# Patient Record
Sex: Male | Born: 1940
Health system: Southern US, Community
[De-identification: ages and names within clinical notes are randomized; demographics above are authoritative.]

## PROBLEM LIST (undated history)

## (undated) DIAGNOSIS — Z5189 Encounter for other specified aftercare: Secondary | ICD-10-CM

## (undated) DIAGNOSIS — E663 Overweight: Secondary | ICD-10-CM

## (undated) DIAGNOSIS — D509 Iron deficiency anemia, unspecified: Secondary | ICD-10-CM

## (undated) DIAGNOSIS — Z87891 Personal history of nicotine dependence: Secondary | ICD-10-CM

## (undated) DIAGNOSIS — I4821 Permanent atrial fibrillation: Secondary | ICD-10-CM

## (undated) DIAGNOSIS — M11861 Other specified crystal arthropathies, right knee: Secondary | ICD-10-CM

## (undated) DIAGNOSIS — I700 Atherosclerosis of aorta: Secondary | ICD-10-CM

## (undated) DIAGNOSIS — K5790 Diverticulosis of intestine, part unspecified, without perforation or abscess without bleeding: Secondary | ICD-10-CM

## (undated) DIAGNOSIS — I10 Essential (primary) hypertension: Secondary | ICD-10-CM

## (undated) DIAGNOSIS — I723 Aneurysm of iliac artery: Secondary | ICD-10-CM

## (undated) DIAGNOSIS — M1A9XX1 Chronic gout, unspecified, with tophus (tophi): Secondary | ICD-10-CM

## (undated) HISTORY — DX: Other specified crystal arthropathies, right knee: M11.861

## (undated) HISTORY — DX: Diverticulosis of intestine, part unspecified, without perforation or abscess without bleeding: K57.90

## (undated) HISTORY — DX: Overweight: E66.3

## (undated) HISTORY — DX: Permanent atrial fibrillation: I48.21

## (undated) HISTORY — DX: Encounter for other specified aftercare: Z51.89

## (undated) HISTORY — DX: Atherosclerosis of aorta: I70.0

## (undated) HISTORY — DX: Essential (primary) hypertension: I10

## (undated) HISTORY — PX: INGUINAL HERNIA REPAIR: SUR1180

## (undated) HISTORY — DX: Chronic gout, unspecified, with tophus (tophi): M1A.9XX1

## (undated) HISTORY — DX: Aneurysm of iliac artery: I72.3

## (undated) HISTORY — PX: KNEE SURGERY: SHX244

---

## 2016-04-27 DIAGNOSIS — Z01818 Encounter for other preprocedural examination: Secondary | ICD-10-CM | POA: Diagnosis not present

## 2016-04-27 DIAGNOSIS — E876 Hypokalemia: Secondary | ICD-10-CM | POA: Diagnosis not present

## 2016-05-05 DIAGNOSIS — S5002XD Contusion of left elbow, subsequent encounter: Secondary | ICD-10-CM | POA: Diagnosis not present

## 2017-04-10 ENCOUNTER — Other Ambulatory Visit: Payer: Self-pay

## 2017-04-10 ENCOUNTER — Ambulatory Visit (INDEPENDENT_AMBULATORY_CARE_PROVIDER_SITE_OTHER): Payer: Medicare (Managed Care) | Admitting: Internal Medicine

## 2017-04-10 ENCOUNTER — Ambulatory Visit (HOSPITAL_COMMUNITY)
Admission: RE | Admit: 2017-04-10 | Discharge: 2017-04-10 | Disposition: A | Discharge status: Home / Self Care | Payer: Medicare (Managed Care) | Source: Ambulatory Visit | Attending: Internal Medicine | Admitting: Internal Medicine

## 2017-04-10 VITALS — BP 131/56 | HR 44 | Temp 97.3°F | Ht 71.0 in | Wt 215.4 lb

## 2017-04-10 DIAGNOSIS — M1A9XX1 Chronic gout, unspecified, with tophus (tophi): Secondary | ICD-10-CM

## 2017-04-10 DIAGNOSIS — Z950 Presence of cardiac pacemaker: Secondary | ICD-10-CM | POA: Insufficient documentation

## 2017-04-10 DIAGNOSIS — I4891 Unspecified atrial fibrillation: Secondary | ICD-10-CM | POA: Diagnosis not present

## 2017-04-10 DIAGNOSIS — R001 Bradycardia, unspecified: Secondary | ICD-10-CM

## 2017-04-10 DIAGNOSIS — Z0189 Encounter for other specified special examinations: Secondary | ICD-10-CM | POA: Diagnosis not present

## 2017-04-10 DIAGNOSIS — I10 Essential (primary) hypertension: Secondary | ICD-10-CM | POA: Diagnosis not present

## 2017-04-10 DIAGNOSIS — D509 Iron deficiency anemia, unspecified: Secondary | ICD-10-CM | POA: Diagnosis not present

## 2017-04-10 DIAGNOSIS — M109 Gout, unspecified: Secondary | ICD-10-CM

## 2017-04-10 DIAGNOSIS — Z79899 Other long term (current) drug therapy: Secondary | ICD-10-CM | POA: Diagnosis not present

## 2017-04-10 HISTORY — DX: Essential (primary) hypertension: I10

## 2017-04-10 HISTORY — DX: Chronic gout, unspecified, with tophus (tophi): M1A.9XX1

## 2017-04-10 MED ORDER — ALLOPURINOL 100 MG PO TABS: 100.0000 mg | ORAL_TABLET | Freq: Every day | ORAL | 0 refills | Status: DC

## 2017-04-10 MED ORDER — DILTIAZEM HCL ER 240 MG PO CP24: 240.0000 mg | ORAL_CAPSULE | Freq: Every day | ORAL | 0 refills | Status: DC

## 2017-04-10 NOTE — Assessment & Plan Note (Addendum)
Patient is new to the clinic; no prior records in chart.  His current medications include lisinopril 2.5 mg daily, diltiazem extended release 240 mg daily, and metoprolol extended release 25 mg daily. Blood pressure 131/56 and pulse 44.  EKG showing heart rate 54, A. fib with slow ventricular response (junctional rhythm).  Patient denies having any chest pain, episodes of syncope, or shortness of breath.  Reports occasionally feeling lightheaded when getting up all of a sudden; no recent episodes.  Plan -Hold metoprolol in the setting of bradycardia -Continue lisinopril -Continue diltiazem -No anticoagulation noticed in his medication list.  We will try to obtain records from his primary care office in [place].  He will also need an echo if not already done to look for valvular etiology of A. Fib. -Check labs including CBC, CMP, and TSH -Return to the clinic in 3 weeks  Addendum: Creatinine 1.3 and GFR 56.  No prior baseline in chart.  TSH normal.  -We are trying to get records from patient's primary care office in [place].

## 2017-04-10 NOTE — Assessment & Plan Note (Addendum)
Allopurinol as listed in his medications.  Patient denies having any joint pain or noticing any tophi.  Plan -Refill allopurinol 100 mg daily -Check uric acid level -We will try to obtain records from his primary care office in [place].  Addendum: Uric acid level 6.5.  Goal is to keep uric acid level less than 6.   -Please assess compliance at follow-up visit before increasing the dose of allopurinol.

## 2017-04-10 NOTE — Patient Instructions (Addendum)
[name] it was nice meeting you today.  -We will try to obtain records from your previous clinic in [place].  -Continue taking all your medications as before except once change - DO NOT take Metoprolol until your next visit.   -Please return to the clinic in 3 weeks for blood pressure and pulse recheck.  At that time, we will let you know whether it is okay for you to start taking metoprolol again.

## 2017-04-10 NOTE — Progress Notes (Addendum)
   CC: Patient is here to establish care and get medication refills.  HPI:  [name] is a 77 y.o. male with a past medical history of hypertension, gout, A. fib presenting to the clinic to establish care and get medication refills.  He recently moved to [place] from [place]. Please see problem based charting for the status of the patient's current and chronic medical conditions.   No past medical history on file. Review of Systems: Pertinent positives mentioned in HPI. Remainder of all ROS negative.   Physical Exam:  Vitals:   04/10/17 1333  BP: (!) 131/56  Pulse: (!) 44  Temp: (!) 97.3 F (36.3 C)  TempSrc: Oral  SpO2: 100%  Weight: 215 lb 6.4 oz (97.7 kg)  Height: 5\' 11"  (1.803 m)   Physical Exam  Constitutional: He is oriented to person, place, and time. He appears well-developed and well-nourished. No distress.  HENT:  Head: Normocephalic and atraumatic.  Eyes: Right eye exhibits no discharge. Left eye exhibits no discharge.  Cardiovascular: Intact distal pulses.  Bradycardic Irregular rhythm  Pulmonary/Chest: Effort normal and breath sounds normal. No respiratory distress. He has no wheezes. He has no rales.  Abdominal: Soft. Bowel sounds are normal. He exhibits no distension. There is no tenderness.  Musculoskeletal: He exhibits no edema.  Neurological: He is alert and oriented to person, place, and time.  Skin: Skin is warm and dry.    Assessment & Plan:   See Encounters Tab for problem based charting.  Patient seen with Dr. Cleda DaubE. Hoffman

## 2017-04-11 DIAGNOSIS — D509 Iron deficiency anemia, unspecified: Secondary | ICD-10-CM | POA: Insufficient documentation

## 2017-04-11 LAB — CMP14 + ANION GAP
A/G RATIO: 1.3 (ref 1.2–2.2)
ALK PHOS: 79 IU/L (ref 39–117)
ALT: 10 IU/L (ref 0–44)
AST: 17 IU/L (ref 0–40)
Albumin: 4.3 g/dL (ref 3.5–4.8)
Anion Gap: 17 mmol/L (ref 10.0–18.0)
BUN/Creatinine Ratio: 9 — ABNORMAL LOW (ref 10–24)
BUN: 13 mg/dL (ref 8–27)
Bilirubin Total: 0.4 mg/dL (ref 0.0–1.2)
CO2: 22 mmol/L (ref 20–29)
Calcium: 9.2 mg/dL (ref 8.6–10.2)
Chloride: 105 mmol/L (ref 96–106)
Creatinine, Ser: 1.39 mg/dL — ABNORMAL HIGH (ref 0.76–1.27)
GFR calc Af Amer: 56 mL/min/{1.73_m2} — ABNORMAL LOW (ref >59)
GFR, EST NON AFRICAN AMERICAN: 48 mL/min/{1.73_m2} — AB (ref >59)
GLOBULIN, TOTAL: 3.2 g/dL (ref 1.5–4.5)
Glucose: 94 mg/dL (ref 65–99)
POTASSIUM: 4.1 mmol/L (ref 3.5–5.2)
SODIUM: 144 mmol/L (ref 134–144)
Total Protein: 7.5 g/dL (ref 6.0–8.5)

## 2017-04-11 LAB — CBC
Hematocrit: 34.9 % — ABNORMAL LOW (ref 37.5–51.0)
Hemoglobin: 10.1 g/dL — ABNORMAL LOW (ref 13.0–17.7)
MCH: 17.7 pg — AB (ref 26.6–33.0)
MCHC: 28.9 g/dL — ABNORMAL LOW (ref 31.5–35.7)
MCV: 61 fL — ABNORMAL LOW (ref 79–97)
PLATELETS: 386 10*3/uL — AB (ref 150–379)
RBC: 5.71 x10E6/uL (ref 4.14–5.80)
RDW: 20 % — ABNORMAL HIGH (ref 12.3–15.4)
WBC: 5.9 10*3/uL (ref 3.4–10.8)

## 2017-04-11 LAB — URIC ACID: Uric Acid: 6.5 mg/dL (ref 3.7–8.6)

## 2017-04-11 LAB — TSH: TSH: 2.63 u[IU]/mL (ref 0.450–4.500)

## 2017-04-11 NOTE — Assessment & Plan Note (Addendum)
Labs showing hemoglobin 10.1 and MCV 61.  Unclear if the anemia is new.  Patient is new to the clinic and we do not have any prior records. -We will try to obtain records from his primary care office in [place] to determine whether he has already undergone colon cancer screening.  -Consider checking FOBT, iron, ferritin, and TIBC at follow-up visit.  He will need an iron supplement if labs indicate iron deficiency anemia.

## 2017-04-11 NOTE — Progress Notes (Signed)
Internal Medicine Clinic Attending  Case discussed with Dr. Loney Lohathore at the time of the visit.  We reviewed the resident's history and exam and pertinent patient test results.  I agree with the assessment, diagnosis, and plan of care documented in the resident's note. To clarify Dr Gardiner Rhymeathore's note, he will need workup for his microcytic anemia including iron studies, if he is anemia he will need a diagnostic workup for IDA.  Working on getting records today for this complex patient, close follow up needed.

## 2017-04-27 ENCOUNTER — Other Ambulatory Visit: Payer: Self-pay | Admitting: *Deleted

## 2017-04-28 MED ORDER — LISINOPRIL 2.5 MG PO TABS: 2.5000 mg | ORAL_TABLET | Freq: Every day | ORAL | 2 refills | Status: DC

## 2017-05-01 ENCOUNTER — Other Ambulatory Visit: Payer: Self-pay

## 2017-05-01 ENCOUNTER — Encounter: Payer: Self-pay | Admitting: Internal Medicine

## 2017-05-01 ENCOUNTER — Ambulatory Visit (INDEPENDENT_AMBULATORY_CARE_PROVIDER_SITE_OTHER): Payer: Medicare (Managed Care) | Admitting: Internal Medicine

## 2017-05-01 VITALS — BP 134/68 | HR 77 | Temp 97.6°F | Ht 71.0 in | Wt 214.3 lb

## 2017-05-01 DIAGNOSIS — M109 Gout, unspecified: Secondary | ICD-10-CM | POA: Diagnosis not present

## 2017-05-01 DIAGNOSIS — D509 Iron deficiency anemia, unspecified: Secondary | ICD-10-CM

## 2017-05-01 DIAGNOSIS — Z79899 Other long term (current) drug therapy: Secondary | ICD-10-CM

## 2017-05-01 DIAGNOSIS — I10 Essential (primary) hypertension: Secondary | ICD-10-CM | POA: Diagnosis not present

## 2017-05-01 NOTE — Patient Instructions (Signed)
It was a pleasure to see you today [name]. Please make the following changes:  -Please continue taking all your medications -Return to clinic in 3 months  If you have any questions or concerns, please call our clinic at [phone] between 9am-5pm and after hours call [phone] and ask for the internal medicine resident on call. If you feel you are having a medical emergency please call 911.   Thank you, we look forward to help you remain healthy!  Lorenso CourierVahini Kristine Chahal, MD Internal Medicine PGY1

## 2017-05-01 NOTE — Progress Notes (Signed)
   CC: Microcytic Anemia  HPI:  Hayden Harmon is a 77 y.o. with hypertension, gout, microcytic anemia who presented for follow up on microcytic anemia workup. Please see problem based charting for evaluation, assessment, and plan.  No past medical history on file. Review of Systems:  Denies cough, palpitations, sob  Physical Exam:  Vitals:   05/01/17 1326  BP: 134/68  Pulse: 77  Temp: 97.6 F (36.4 C)  TempSrc: Oral  SpO2: 100%  Weight: 214 lb 4.8 oz (97.2 kg)  Height: 5\' 11"  (1.803 m)   Physical Exam  Constitutional: He appears well-developed and well-nourished. No distress.  HENT:  Head: Normocephalic and atraumatic.  Eyes: Conjunctivae are normal.  No pallor noted   Cardiovascular: Normal rate, regular rhythm and normal heart sounds.  Respiratory: Effort normal and breath sounds normal. No respiratory distress. He has no wheezes.  GI: Soft. Bowel sounds are normal. He exhibits no distension. There is no tenderness.  Musculoskeletal: He exhibits no edema.  Neurological: He is alert.  Skin: Skin is warm and dry. No rash noted. He is not diaphoretic. No erythema.  Psychiatric: He has a normal mood and affect. His behavior is normal. Judgment and thought content normal.     Assessment & Plan:   See Encounters Tab for problem based charting.  Patient discussed with Dr. Criselda PeachesMullen

## 2017-05-02 LAB — IRON AND TIBC
Iron Saturation: 5 % — CL (ref 15–55)
Iron: 21 ug/dL — ABNORMAL LOW (ref 38–169)
TIBC: 456 ug/dL — AB (ref 250–450)
UIBC: 435 ug/dL — AB (ref 111–343)

## 2017-05-02 LAB — MAGNESIUM: MAGNESIUM: 2 mg/dL (ref 1.6–2.3)

## 2017-05-02 LAB — FERRITIN: Ferritin: 16 ng/mL — ABNORMAL LOW (ref 30–400)

## 2017-05-02 NOTE — Assessment & Plan Note (Signed)
The patient is new to [facility] clinic as of 04/10/17. On CBC that was ordered the patient was noted to have hb=10.1, mcv=61. The patient's records are still being obtained from his previous pcp's office in [place].  Assessment The patient has microcytic anemia from unknown cause.   Plan  -Ordered iron, ferritin, and TIBC to determine cause of anemia.  -The patient states that he does not recall when he last got a colonoscopy done so will order FIC testing while getting records

## 2017-05-02 NOTE — Assessment & Plan Note (Signed)
  The patient has well controlled blood pressure of 134/68 on lisionpril 2.5mg  qd, metoprolol 25mg  qd, and diltiazem 240mg  qd.   The patient is currently being prescribed magnesium for unknown reason. Ordered magnesium level to determine if patient truly needs.

## 2017-05-02 NOTE — Assessment & Plan Note (Signed)
The patient has a history of gout for which he is currently taking allopurinol 100mg  qd. The patient states that his gout flares usually occur in his dip joints. The patient states that he currently does not have any pain in his joints.

## 2017-05-03 NOTE — Progress Notes (Signed)
Internal Medicine Clinic Attending  Case discussed with Dr. Chundi at the time of the visit.  We reviewed the resident's history and exam and pertinent patient test results.  I agree with the assessment, diagnosis, and plan of care documented in the resident's note. 

## 2017-05-07 ENCOUNTER — Other Ambulatory Visit: Payer: Self-pay | Admitting: Internal Medicine

## 2017-05-07 DIAGNOSIS — M1000 Idiopathic gout, unspecified site: Secondary | ICD-10-CM

## 2017-05-07 DIAGNOSIS — I10 Essential (primary) hypertension: Secondary | ICD-10-CM

## 2017-05-08 ENCOUNTER — Encounter: Payer: Self-pay | Admitting: Internal Medicine

## 2017-05-08 MED ORDER — ALLOPURINOL 100 MG PO TABS: 200.0000 mg | ORAL_TABLET | Freq: Every day | ORAL | 3 refills | Status: DC

## 2017-05-08 MED ORDER — FERROUS SULFATE 325 (65 FE) MG PO TABS: 325.0000 mg | ORAL_TABLET | Freq: Every day | ORAL | 1 refills | Status: DC

## 2017-05-08 NOTE — Telephone Encounter (Signed)
I have yet to meet [name].  Thus I reviewed the available record and have some concerns about reprecribing the diltiazem in the setting of concomitant metoprolol.  His initial pulse with us was 44 on this combination, but it appears to have been 77 on a more recent visit.  He states he was told to stop the metoprolol XL at the initial visit and had only been taking the diltiazem since, including during follow-up with a pulse of 77.  There was an ECG that demonstrated atrial fibrillation with a junctional escape rhythm when he was on 2 nodal agents.  I therefore renewed the diltiazem and removed the metoprolol from his medication list.  I will repeat the ECG while on a single nodal agent at the follow-up visit.  With regards to the allopurinol, it appears that the last uric acid was above target while on 100 mg daily.  Thus, rather than refilling this dose it should be increased to 200 mg daily.  I discussed this with him and he was in favor of the change.  It was therefore increased to 200 mg daily.  We will recheck a uric acid level at the follow-up visit to assure he is < 6.0.

## 2017-05-08 NOTE — Progress Notes (Signed)
The patient was diagnosed with iron deficiency anemia per low ferritin, iron, iron saturation, and high tibc.   -Prescribed ferrous sulfate 325mg  daily -Have not been able to get in touch with patient to inform him to pick up prescription. Will continue trying.

## 2017-05-21 ENCOUNTER — Encounter (HOSPITAL_COMMUNITY): Payer: Self-pay | Admitting: Internal Medicine

## 2017-05-21 ENCOUNTER — Emergency Department (HOSPITAL_COMMUNITY)
Admission: EM | Admit: 2017-05-21 | Discharge: 2017-05-21 | Disposition: A | Discharge status: Home / Self Care | Payer: Medicare Other | Attending: Emergency Medicine | Admitting: Emergency Medicine

## 2017-05-21 DIAGNOSIS — R112 Nausea with vomiting, unspecified: Secondary | ICD-10-CM | POA: Insufficient documentation

## 2017-05-21 DIAGNOSIS — I480 Paroxysmal atrial fibrillation: Secondary | ICD-10-CM | POA: Insufficient documentation

## 2017-05-21 DIAGNOSIS — I10 Essential (primary) hypertension: Secondary | ICD-10-CM | POA: Diagnosis not present

## 2017-05-21 DIAGNOSIS — R197 Diarrhea, unspecified: Secondary | ICD-10-CM | POA: Diagnosis not present

## 2017-05-21 DIAGNOSIS — Z7982 Long term (current) use of aspirin: Secondary | ICD-10-CM | POA: Insufficient documentation

## 2017-05-21 DIAGNOSIS — Z7902 Long term (current) use of antithrombotics/antiplatelets: Secondary | ICD-10-CM | POA: Diagnosis not present

## 2017-05-21 DIAGNOSIS — I4891 Unspecified atrial fibrillation: Secondary | ICD-10-CM

## 2017-05-21 DIAGNOSIS — R000 Tachycardia, unspecified: Secondary | ICD-10-CM | POA: Diagnosis not present

## 2017-05-21 LAB — URINALYSIS, ROUTINE W REFLEX MICROSCOPIC
Bilirubin Urine: NEGATIVE
GLUCOSE, UA: NEGATIVE mg/dL
HGB URINE DIPSTICK: NEGATIVE
Ketones, ur: NEGATIVE mg/dL
Leukocytes, UA: NEGATIVE
Nitrite: NEGATIVE
PH: 5 (ref 5.0–8.0)
PROTEIN: NEGATIVE mg/dL
SPECIFIC GRAVITY, URINE: 1.017 (ref 1.005–1.030)

## 2017-05-21 LAB — COMPREHENSIVE METABOLIC PANEL
ALK PHOS: 63 U/L (ref 38–126)
ALT: 25 U/L (ref 17–63)
AST: 15 U/L (ref 15–41)
Albumin: 3.8 g/dL (ref 3.5–5.0)
Anion gap: 12 (ref 5–15)
BUN: 40 mg/dL — AB (ref 6–20)
CALCIUM: 8 mg/dL — AB (ref 8.9–10.3)
CHLORIDE: 103 mmol/L (ref 101–111)
CO2: 25 mmol/L (ref 22–32)
CREATININE: 1.55 mg/dL — AB (ref 0.61–1.24)
GFR, EST AFRICAN AMERICAN: 48 mL/min — AB (ref >60)
GFR, EST NON AFRICAN AMERICAN: 41 mL/min — AB (ref >60)
Glucose, Bld: 148 mg/dL — ABNORMAL HIGH (ref 65–99)
Potassium: 3 mmol/L — ABNORMAL LOW (ref 3.5–5.1)
SODIUM: 140 mmol/L (ref 135–145)
Total Bilirubin: 1.1 mg/dL (ref 0.3–1.2)
Total Protein: 6.9 g/dL (ref 6.5–8.1)

## 2017-05-21 LAB — CBC
HCT: 40.6 % (ref 39.0–52.0)
Hemoglobin: 11.6 g/dL — ABNORMAL LOW (ref 13.0–17.0)
MCH: 17.9 pg — AB (ref 26.0–34.0)
MCHC: 28.6 g/dL — ABNORMAL LOW (ref 30.0–36.0)
MCV: 62.6 fL — AB (ref 78.0–100.0)
Platelets: 351 10*3/uL (ref 150–400)
RBC: 6.49 MIL/uL — AB (ref 4.22–5.81)
RDW: 21 % — AB (ref 11.5–15.5)
WBC: 13.1 10*3/uL — ABNORMAL HIGH (ref 4.0–10.5)

## 2017-05-21 LAB — LIPASE, BLOOD: Lipase: 28 U/L (ref 11–51)

## 2017-05-21 MED ORDER — ONDANSETRON HCL 4 MG/2ML IJ SOLN
4.0000 mg | Freq: Once | INTRAMUSCULAR | Status: AC
  Administered 2017-05-21: 4 mg via INTRAVENOUS
  Filled 2017-05-21: qty 2

## 2017-05-21 MED ORDER — SODIUM CHLORIDE 0.9 % IV BOLUS (SEPSIS)
1000.0000 mL | Freq: Once | INTRAVENOUS | Status: AC
  Administered 2017-05-21: 1000 mL via INTRAVENOUS

## 2017-05-21 MED ORDER — DILTIAZEM HCL 25 MG/5ML IV SOLN
10.0000 mg | Freq: Once | INTRAVENOUS | Status: AC
  Administered 2017-05-21: 10 mg via INTRAVENOUS
  Filled 2017-05-21 (×2): qty 5

## 2017-05-21 MED ORDER — ONDANSETRON 4 MG PO TBDP: 4.0000 mg | ORAL_TABLET | Freq: Three times a day (TID) | ORAL | 0 refills | Status: DC | PRN

## 2017-05-21 MED ORDER — POTASSIUM CHLORIDE CRYS ER 20 MEQ PO TBCR
40.0000 meq | EXTENDED_RELEASE_TABLET | Freq: Once | ORAL | Status: AC
  Administered 2017-05-21: 40 meq via ORAL
  Filled 2017-05-21: qty 2

## 2017-05-21 MED ORDER — SODIUM CHLORIDE 0.9 % IV BOLUS (SEPSIS)
500.0000 mL | Freq: Once | INTRAVENOUS | Status: AC
  Administered 2017-05-21: 500 mL via INTRAVENOUS

## 2017-05-21 NOTE — ED Notes (Signed)
Bed: [id number] Expected date:  Expected time:  Means of arrival:  Comments: 77 yo N/V/D

## 2017-05-21 NOTE — ED Triage Notes (Signed)
[facility] from home c/o N/V/D since Wednesday that has become worse this morning. Denies SOB/Chest pain.Pt given 700mLs NS and 4mg  zofran by [facility]. Pt has hx of a-fib and HR is currently 162.

## 2017-05-21 NOTE — ED Notes (Signed)
ED Provider at bedside. 

## 2017-05-21 NOTE — ED Provider Notes (Addendum)
[facility]-EMERGENCY DEPT Provider Note   CSN: [id number] Arrival date & time: 05/21/17  1126     History   Chief Complaint Chief Complaint  Patient presents with  . Nausea  . Emesis  . Diarrhea    HPI Hayden Harmon is a 77 y.o. male.  77 year old male with past medical history including hypertension, gout, iron deficiency anemia who presents with nausea, vomiting, and diarrhea.  Patient began getting sick 4 days ago with vomiting and nonbloody diarrhea.  His last episode of diarrhea was 2 nights ago but his vomiting is continued, last episode was approximately 1 hour ago.  He denies any associated abdominal pain.  No sick contacts.  No fevers or URI symptoms.  No urinary symptoms.  He has not had his morning medications yet today.   The history is provided by the patient.  Emesis   Associated symptoms include diarrhea.  Diarrhea   Associated symptoms include vomiting.    Past Medical History:  Diagnosis Date  . Essential hypertension 04/10/2017    Patient Active Problem List   Diagnosis Date Noted  . Iron deficiency anemia 04/11/2017  . Essential hypertension 04/10/2017  . Gout 04/10/2017    History reviewed. No pertinent surgical history.     Home Medications    Prior to Admission medications   Medication Sig Start Date End Date Taking? Authorizing Provider  allopurinol (ZYLOPRIM) 100 MG tablet Take 2 tablets (200 mg total) by mouth daily. Patient taking differently: Take 100 mg by mouth daily.  05/08/17  Yes Doneen Poisson, MD  aspirin EC 81 MG tablet Take 81 mg by mouth daily.   Yes [provider]  clopidogrel (PLAVIX) 75 MG tablet Take 75 mg by mouth daily.   Yes [provider]  diltiazem (DILT-XR) 240 MG 24 hr capsule Take 1 capsule (240 mg total) by mouth daily. 05/08/17  Yes Doneen Poisson, MD  lisinopril (PRINIVIL,ZESTRIL) 2.5 MG tablet Take 1 tablet (2.5 mg total) by mouth daily. 04/28/17  Yes Burns Spain, MD  magnesium oxide (MAG-OX) 400 MG tablet Take 400 mg by mouth 2 (two) times daily.   Yes [provider]  metoprolol succinate (TOPROL-XL) 25 MG 24 hr tablet Take 25 mg by mouth daily.   Yes [provider]  ferrous sulfate 325 (65 FE) MG tablet Take 1 tablet (325 mg total) by mouth daily. Patient not taking: Reported on 05/21/2017 05/08/17 06/07/17  Lorenso Courier, MD  ondansetron (ZOFRAN ODT) 4 MG disintegrating tablet Take 1 tablet (4 mg total) by mouth every 8 (eight) hours as needed for nausea or vomiting. 05/21/17   Lucy Woolever, Ambrose Finland, MD    Family History No family history on file.  Social History Social History   Tobacco Use  . Smoking status: Never Smoker  . Smokeless tobacco: Never Used  Substance Use Topics  . Alcohol use: Not on file  . Drug use: Not on file     Allergies   Patient has no known allergies.   Review of Systems Review of Systems  Gastrointestinal: Positive for diarrhea and vomiting.   All other systems reviewed and are negative except that which was mentioned in HPI   Physical Exam Updated Vital Signs BP 129/86   Pulse (!) 110   Temp (!) 97.5 F (36.4 C) (Oral)   Resp 17   SpO2 96%   Physical Exam  Constitutional: He is oriented to person, place, and time. He appears well-developed and well-nourished. No  distress.  HENT:  Head: Normocephalic and atraumatic.  Mouth/Throat: Oropharynx is clear and moist.  Moist mucous membranes  Eyes: Conjunctivae are normal. Pupils are equal, round, and reactive to light.  Neck: Neck supple.  Cardiovascular: Normal heart sounds. An irregularly irregular rhythm present. Tachycardia present.  No murmur heard. Pulmonary/Chest: Effort normal and breath sounds normal.  Abdominal: Soft. Bowel sounds are normal. He exhibits no distension. There is no tenderness.  Musculoskeletal: He exhibits no edema.  Neurological: He is alert and oriented to person, place, and time.  Fluent speech    Skin: Skin is warm and dry.  Psychiatric: He has a normal mood and affect. Judgment normal.  Nursing note and vitals reviewed.    ED Treatments / Results  Labs (all labs ordered are listed, but only abnormal results are displayed) Labs Reviewed  COMPREHENSIVE METABOLIC PANEL - Abnormal; Notable for the following components:      Result Value   Potassium 3.0 (*)    Glucose, Bld 148 (*)    BUN 40 (*)    Creatinine, Ser 1.55 (*)    Calcium 8.0 (*)    GFR calc non Af Amer 41 (*)    GFR calc Af Amer 48 (*)    All other components within normal limits  CBC - Abnormal; Notable for the following components:   WBC 13.1 (*)    RBC 6.49 (*)    Hemoglobin 11.6 (*)    MCV 62.6 (*)    MCH 17.9 (*)    MCHC 28.6 (*)    RDW 21.0 (*)    All other components within normal limits  URINALYSIS, ROUTINE W REFLEX MICROSCOPIC - Abnormal; Notable for the following components:   Color, Urine AMBER (*)    Bacteria, UA RARE (*)    Squamous Epithelial / LPF 0-5 (*)    All other components within normal limits  LIPASE, BLOOD    EKG  EKG Interpretation  Date/Time:  Sunday May 21 2017 11:42:18 EDT Ventricular Rate:  119 PR Interval:    QRS Duration: 91 QT Interval:  285 QTC Calculation: 401 R Axis:   24 Text Interpretation:  Atrial fibrillation Probable posterior infarct, recent Lateral leads are also involved A fib with RVR new from previous which was slow A fib Confirmed by Frederick Peers [phone]) on 05/21/2017 12:42:37 PM       Radiology No results found.  Procedures .Critical Care Performed by: Laurence Spates, MD Authorized by: Laurence Spates, MD   Critical care provider statement:    Critical care time (minutes):  30   Critical care was necessary to treat or prevent imminent or life-threatening deterioration of the following conditions:  Cardiac failure   Critical care was time spent personally by me on the following activities:  Development of treatment plan with  patient or surrogate, evaluation of patient's response to treatment, examination of patient, obtaining history from patient or surrogate, ordering and performing treatments and interventions, ordering and review of laboratory studies, ordering and review of radiographic studies and re-evaluation of patient's condition   (including critical care time)  Medications Ordered in ED Medications  sodium chloride 0.9 % bolus 1,000 mL (0 mLs Intravenous Stopped 05/21/17 1410)  ondansetron (ZOFRAN) injection 4 mg (4 mg Intravenous Given 05/21/17 1259)  potassium chloride SA (K-DUR,KLOR-CON) CR tablet 40 mEq (40 mEq Oral Given 05/21/17 1323)  diltiazem (CARDIZEM) injection 10 mg (10 mg Intravenous Given 05/21/17 1548)  sodium chloride 0.9 % bolus 500 mL (0 mLs  Intravenous Stopped 05/21/17 1543)     Initial Impression / Assessment and Plan / ED Course  I have reviewed the triage vital signs and the nursing notes.  Pertinent labs & imaging results that were available during my care of the patient were reviewed by me and considered in my medical decision making (see chart for details).     Pt well appearing on exam, tachycardic with atrial fibrillation with RVR but this is likely due to the fact that he has not had his diltiazem this morning.  His abdomen was soft and nontender and he denied any abdominal pain.  Gave IV fluids and Zofran and obtain labs.  Labs show potassium 3.0 likely from his GI losses.  Gave oral repletion.  Creatinine 1.55, WBC 13.1, hemoglobin 11.6, normal LFTs and lipase.  Patient tolerating liquids here with resolution of vomiting.  He has continued to deny any abdominal pain therefore I do not feel he needs any imaging. Gave home dilt and 1 dose IV dilt for rate control.  Is completely asymptomatic and sleeping on reassessment.  His heart rate has improved.  I suspect his RVR was related to no medication today as well as some dehydration.  I have encouraged to continue home medications.   Extensively reviewed return precautions.  He voiced understanding. Final Clinical Impressions(s) / ED Diagnoses   Final diagnoses:  Nausea vomiting and diarrhea  Atrial fibrillation with RVR [facility](HCC)    ED Discharge Orders        Ordered    ondansetron (ZOFRAN ODT) 4 MG disintegrating tablet  Every 8 hours PRN     05/21/17 1631       Emmary Culbreath, Ambrose Finlandachel Morgan, MD 05/21/17 1633    Arriana Lohmann, Ambrose Finlandachel Morgan, MD 05/30/17 1051

## 2017-05-21 NOTE — ED Notes (Signed)
Patient tolerating liquids and medications well. No emesis episodes noted by this RN.

## 2017-05-22 ENCOUNTER — Telehealth (HOSPITAL_COMMUNITY): Payer: Self-pay | Admitting: *Deleted

## 2017-05-22 NOTE — Telephone Encounter (Signed)
LM on vcml of pref # for pt to [facility] and sched appt with afib clinic.  Pt left ED in afib.

## 2017-05-25 ENCOUNTER — Encounter (HOSPITAL_COMMUNITY): Payer: Self-pay | Admitting: Nurse Practitioner

## 2017-05-25 ENCOUNTER — Other Ambulatory Visit: Payer: Self-pay

## 2017-05-25 ENCOUNTER — Encounter (HOSPITAL_COMMUNITY): Payer: Self-pay | Admitting: *Deleted

## 2017-05-25 ENCOUNTER — Emergency Department (HOSPITAL_COMMUNITY): Payer: Medicare Other

## 2017-05-25 ENCOUNTER — Ambulatory Visit (HOSPITAL_BASED_OUTPATIENT_CLINIC_OR_DEPARTMENT_OTHER)
Admission: RE | Admit: 2017-05-25 | Discharge: 2017-05-25 | Disposition: A | Discharge status: Home / Self Care | Payer: Medicare Other | Source: Ambulatory Visit | Attending: Nurse Practitioner | Admitting: Nurse Practitioner

## 2017-05-25 ENCOUNTER — Ambulatory Visit: Payer: Medicare Other

## 2017-05-25 ENCOUNTER — Inpatient Hospital Stay (HOSPITAL_COMMUNITY)
Admission: EM | Admit: 2017-05-25 | Discharge: 2017-06-05 | DRG: 336 | Disposition: A | Discharge status: Home Health | Payer: Medicare Other | Attending: Student in an Organized Health Care Education/Training Program | Admitting: Student in an Organized Health Care Education/Training Program

## 2017-05-25 VITALS — BP 132/56 | HR 173 | Ht 71.0 in | Wt 191.0 lb

## 2017-05-25 DIAGNOSIS — A09 Infectious gastroenteritis and colitis, unspecified: Secondary | ICD-10-CM | POA: Diagnosis not present

## 2017-05-25 DIAGNOSIS — Z7902 Long term (current) use of antithrombotics/antiplatelets: Secondary | ICD-10-CM | POA: Diagnosis not present

## 2017-05-25 DIAGNOSIS — R197 Diarrhea, unspecified: Secondary | ICD-10-CM

## 2017-05-25 DIAGNOSIS — N390 Urinary tract infection, site not specified: Secondary | ICD-10-CM | POA: Diagnosis not present

## 2017-05-25 DIAGNOSIS — N179 Acute kidney failure, unspecified: Secondary | ICD-10-CM | POA: Diagnosis present

## 2017-05-25 DIAGNOSIS — N183 Chronic kidney disease, stage 3 (moderate): Secondary | ICD-10-CM | POA: Diagnosis present

## 2017-05-25 DIAGNOSIS — E871 Hypo-osmolality and hyponatremia: Secondary | ICD-10-CM | POA: Diagnosis not present

## 2017-05-25 DIAGNOSIS — I4821 Permanent atrial fibrillation: Secondary | ICD-10-CM | POA: Diagnosis present

## 2017-05-25 DIAGNOSIS — R1110 Vomiting, unspecified: Secondary | ICD-10-CM

## 2017-05-25 DIAGNOSIS — B965 Pseudomonas (aeruginosa) (mallei) (pseudomallei) as the cause of diseases classified elsewhere: Secondary | ICD-10-CM | POA: Diagnosis not present

## 2017-05-25 DIAGNOSIS — R9431 Abnormal electrocardiogram [ECG] [EKG]: Secondary | ICD-10-CM | POA: Insufficient documentation

## 2017-05-25 DIAGNOSIS — I700 Atherosclerosis of aorta: Secondary | ICD-10-CM

## 2017-05-25 DIAGNOSIS — Z7982 Long term (current) use of aspirin: Secondary | ICD-10-CM | POA: Diagnosis not present

## 2017-05-25 DIAGNOSIS — K5651 Intestinal adhesions [bands], with partial obstruction: Secondary | ICD-10-CM | POA: Diagnosis not present

## 2017-05-25 DIAGNOSIS — I4891 Unspecified atrial fibrillation: Secondary | ICD-10-CM

## 2017-05-25 DIAGNOSIS — K529 Noninfective gastroenteritis and colitis, unspecified: Secondary | ICD-10-CM | POA: Diagnosis not present

## 2017-05-25 DIAGNOSIS — I480 Paroxysmal atrial fibrillation: Secondary | ICD-10-CM | POA: Diagnosis present

## 2017-05-25 DIAGNOSIS — I252 Old myocardial infarction: Secondary | ICD-10-CM

## 2017-05-25 DIAGNOSIS — R112 Nausea with vomiting, unspecified: Secondary | ICD-10-CM | POA: Diagnosis not present

## 2017-05-25 DIAGNOSIS — E876 Hypokalemia: Secondary | ICD-10-CM

## 2017-05-25 DIAGNOSIS — K56609 Unspecified intestinal obstruction, unspecified as to partial versus complete obstruction: Secondary | ICD-10-CM | POA: Diagnosis not present

## 2017-05-25 DIAGNOSIS — I129 Hypertensive chronic kidney disease with stage 1 through stage 4 chronic kidney disease, or unspecified chronic kidney disease: Secondary | ICD-10-CM | POA: Diagnosis present

## 2017-05-25 DIAGNOSIS — Z4659 Encounter for fitting and adjustment of other gastrointestinal appliance and device: Secondary | ICD-10-CM

## 2017-05-25 DIAGNOSIS — Z978 Presence of other specified devices: Secondary | ICD-10-CM | POA: Diagnosis not present

## 2017-05-25 DIAGNOSIS — Z4682 Encounter for fitting and adjustment of non-vascular catheter: Secondary | ICD-10-CM | POA: Diagnosis not present

## 2017-05-25 DIAGNOSIS — Z0189 Encounter for other specified special examinations: Secondary | ICD-10-CM

## 2017-05-25 DIAGNOSIS — R634 Abnormal weight loss: Secondary | ICD-10-CM | POA: Diagnosis not present

## 2017-05-25 DIAGNOSIS — I2510 Atherosclerotic heart disease of native coronary artery without angina pectoris: Secondary | ICD-10-CM | POA: Diagnosis not present

## 2017-05-25 DIAGNOSIS — D509 Iron deficiency anemia, unspecified: Secondary | ICD-10-CM | POA: Diagnosis not present

## 2017-05-25 DIAGNOSIS — E861 Hypovolemia: Secondary | ICD-10-CM | POA: Diagnosis not present

## 2017-05-25 DIAGNOSIS — R4182 Altered mental status, unspecified: Secondary | ICD-10-CM | POA: Diagnosis not present

## 2017-05-25 DIAGNOSIS — N323 Diverticulum of bladder: Secondary | ICD-10-CM | POA: Diagnosis not present

## 2017-05-25 DIAGNOSIS — E860 Dehydration: Secondary | ICD-10-CM | POA: Diagnosis present

## 2017-05-25 DIAGNOSIS — I482 Chronic atrial fibrillation: Secondary | ICD-10-CM | POA: Diagnosis not present

## 2017-05-25 DIAGNOSIS — N400 Enlarged prostate without lower urinary tract symptoms: Secondary | ICD-10-CM | POA: Diagnosis present

## 2017-05-25 DIAGNOSIS — K565 Intestinal adhesions [bands], unspecified as to partial versus complete obstruction: Secondary | ICD-10-CM | POA: Diagnosis not present

## 2017-05-25 DIAGNOSIS — I10 Essential (primary) hypertension: Secondary | ICD-10-CM | POA: Diagnosis not present

## 2017-05-25 DIAGNOSIS — Z79899 Other long term (current) drug therapy: Secondary | ICD-10-CM | POA: Diagnosis not present

## 2017-05-25 DIAGNOSIS — R1114 Bilious vomiting: Secondary | ICD-10-CM | POA: Diagnosis not present

## 2017-05-25 DIAGNOSIS — R143 Flatulence: Secondary | ICD-10-CM | POA: Diagnosis not present

## 2017-05-25 DIAGNOSIS — B9689 Other specified bacterial agents as the cause of diseases classified elsewhere: Secondary | ICD-10-CM | POA: Diagnosis not present

## 2017-05-25 DIAGNOSIS — R109 Unspecified abdominal pain: Secondary | ICD-10-CM | POA: Diagnosis not present

## 2017-05-25 HISTORY — DX: Permanent atrial fibrillation: I48.21

## 2017-05-25 HISTORY — DX: Personal history of nicotine dependence: Z87.891

## 2017-05-25 HISTORY — DX: Iron deficiency anemia, unspecified: D50.9

## 2017-05-25 HISTORY — DX: Atherosclerosis of aorta: I70.0

## 2017-05-25 LAB — CBC WITH DIFFERENTIAL/PLATELET
BASOS PCT: 0 %
Basophils Absolute: 0 10*3/uL (ref 0.0–0.1)
EOS PCT: 1 %
Eosinophils Absolute: 0.2 10*3/uL (ref 0.0–0.7)
HCT: 50.3 % (ref 39.0–52.0)
HEMOGLOBIN: 15.4 g/dL (ref 13.0–17.0)
Lymphocytes Relative: 10 %
Lymphs Abs: 1.6 10*3/uL (ref 0.7–4.0)
MCH: 18.6 pg — AB (ref 26.0–34.0)
MCHC: 30.6 g/dL (ref 30.0–36.0)
MCV: 60.8 fL — AB (ref 78.0–100.0)
MONO ABS: 1.9 10*3/uL — AB (ref 0.1–1.0)
Monocytes Relative: 12 %
NEUTROS PCT: 77 %
Neutro Abs: 12.3 10*3/uL — ABNORMAL HIGH (ref 1.7–7.7)
PLATELETS: 419 10*3/uL — AB (ref 150–400)
RBC: 8.27 MIL/uL — ABNORMAL HIGH (ref 4.22–5.81)
RDW: 21.8 % — ABNORMAL HIGH (ref 11.5–15.5)
WBC: 16 10*3/uL — ABNORMAL HIGH (ref 4.0–10.5)

## 2017-05-25 LAB — RAPID URINE DRUG SCREEN, HOSP PERFORMED
Amphetamines: NOT DETECTED
Barbiturates: NOT DETECTED
Benzodiazepines: NOT DETECTED
Cocaine: NOT DETECTED
Opiates: NOT DETECTED
Tetrahydrocannabinol: NOT DETECTED

## 2017-05-25 LAB — I-STAT TROPONIN, ED: TROPONIN I, POC: 0 ng/mL (ref 0.00–0.08)

## 2017-05-25 LAB — BASIC METABOLIC PANEL
Anion gap: 17 — ABNORMAL HIGH (ref 5–15)
Anion gap: 13 (ref 5–15)
BUN: 103 mg/dL — ABNORMAL HIGH (ref 6–20)
BUN: 96 mg/dL — ABNORMAL HIGH (ref 6–20)
CO2: 19 mmol/L — ABNORMAL LOW (ref 22–32)
CO2: 21 mmol/L — ABNORMAL LOW (ref 22–32)
Calcium: 7.1 mg/dL — ABNORMAL LOW (ref 8.9–10.3)
Calcium: 7.2 mg/dL — ABNORMAL LOW (ref 8.9–10.3)
Chloride: 95 mmol/L — ABNORMAL LOW (ref 101–111)
Chloride: 96 mmol/L — ABNORMAL LOW (ref 101–111)
Creatinine, Ser: 3.32 mg/dL — ABNORMAL HIGH (ref 0.61–1.24)
Creatinine, Ser: 3.14 mg/dL — ABNORMAL HIGH (ref 0.61–1.24)
GFR calc Af Amer: 19 mL/min — ABNORMAL LOW (ref >60)
GFR calc Af Amer: 20 mL/min — ABNORMAL LOW (ref >60)
GFR calc non Af Amer: 16 mL/min — ABNORMAL LOW (ref >60)
GFR calc non Af Amer: 18 mL/min — ABNORMAL LOW (ref >60)
Glucose, Bld: 77 mg/dL (ref 65–99)
Glucose, Bld: 94 mg/dL (ref 65–99)
Potassium: 3.2 mmol/L — ABNORMAL LOW (ref 3.5–5.1)
Potassium: 3.4 mmol/L — ABNORMAL LOW (ref 3.5–5.1)
Sodium: 131 mmol/L — ABNORMAL LOW (ref 135–145)
Sodium: 130 mmol/L — ABNORMAL LOW (ref 135–145)

## 2017-05-25 LAB — COMPREHENSIVE METABOLIC PANEL
ALK PHOS: 73 U/L (ref 38–126)
ALT: 12 U/L — AB (ref 17–63)
AST: 21 U/L (ref 15–41)
Albumin: 4.3 g/dL (ref 3.5–5.0)
Anion gap: 20 — ABNORMAL HIGH (ref 5–15)
BILIRUBIN TOTAL: 0.9 mg/dL (ref 0.3–1.2)
BUN: 111 mg/dL — ABNORMAL HIGH (ref 6–20)
CALCIUM: 8.6 mg/dL — AB (ref 8.9–10.3)
CO2: 22 mmol/L (ref 22–32)
CREATININE: 4.44 mg/dL — AB (ref 0.61–1.24)
Chloride: 86 mmol/L — ABNORMAL LOW (ref 101–111)
GFR, EST AFRICAN AMERICAN: 13 mL/min — AB (ref >60)
GFR, EST NON AFRICAN AMERICAN: 12 mL/min — AB (ref >60)
Glucose, Bld: 217 mg/dL — ABNORMAL HIGH (ref 65–99)
Potassium: 3.8 mmol/L (ref 3.5–5.1)
Sodium: 128 mmol/L — ABNORMAL LOW (ref 135–145)
Total Protein: 8.3 g/dL — ABNORMAL HIGH (ref 6.5–8.1)

## 2017-05-25 LAB — URINALYSIS, ROUTINE W REFLEX MICROSCOPIC
Bilirubin Urine: NEGATIVE
Glucose, UA: NEGATIVE mg/dL
Hgb urine dipstick: NEGATIVE
Ketones, ur: NEGATIVE mg/dL
Leukocytes, UA: NEGATIVE
Nitrite: NEGATIVE
Protein, ur: NEGATIVE mg/dL
Specific Gravity, Urine: 1.012 (ref 1.005–1.030)
pH: 5 (ref 5.0–8.0)

## 2017-05-25 LAB — LIPASE, BLOOD: Lipase: 43 U/L (ref 11–51)

## 2017-05-25 MED ORDER — SODIUM CHLORIDE 0.9 % IV BOLUS (SEPSIS)
1000.0000 mL | Freq: Once | INTRAVENOUS | Status: AC
  Administered 2017-05-25: 1000 mL via INTRAVENOUS

## 2017-05-25 MED ORDER — DILTIAZEM HCL 25 MG/5ML IV SOLN
10.0000 mg | Freq: Once | INTRAVENOUS | Status: AC
  Administered 2017-05-25: 10 mg via INTRAVENOUS
  Filled 2017-05-25: qty 5

## 2017-05-25 MED ORDER — ONDANSETRON HCL 4 MG/2ML IJ SOLN
4.0000 mg | Freq: Once | INTRAMUSCULAR | Status: AC
  Administered 2017-05-25: 4 mg via INTRAVENOUS
  Filled 2017-05-25: qty 2

## 2017-05-25 MED ORDER — HEPARIN (PORCINE) IN NACL 100-0.45 UNIT/ML-% IJ SOLN
1400.0000 [IU]/h | INTRAMUSCULAR | Status: DC
  Administered 2017-05-25: 1300 [IU]/h via INTRAVENOUS
  Filled 2017-05-25 (×2): qty 250

## 2017-05-25 MED ORDER — ONDANSETRON HCL 4 MG/2ML IJ SOLN
4.0000 mg | Freq: Four times a day (QID) | INTRAMUSCULAR | Status: DC | PRN
  Administered 2017-05-27 (×3): 4 mg via INTRAVENOUS
  Filled 2017-05-25 (×3): qty 2

## 2017-05-25 MED ORDER — ACETAMINOPHEN 650 MG RE SUPP: 650.0000 mg | Freq: Four times a day (QID) | RECTAL | Status: DC | PRN

## 2017-05-25 MED ORDER — ASPIRIN EC 81 MG PO TBEC
81.0000 mg | DELAYED_RELEASE_TABLET | Freq: Every day | ORAL | Status: DC
  Administered 2017-05-25: 81 mg via ORAL
  Filled 2017-05-25: qty 1

## 2017-05-25 MED ORDER — DILTIAZEM HCL ER COATED BEADS 240 MG PO CP24: 240.0000 mg | ORAL_CAPSULE | Freq: Every day | ORAL | Status: DC

## 2017-05-25 MED ORDER — SODIUM CHLORIDE 0.9 % IV SOLN
INTRAVENOUS | Status: AC
  Administered 2017-05-25: 21:00:00 via INTRAVENOUS

## 2017-05-25 MED ORDER — CLOPIDOGREL BISULFATE 75 MG PO TABS
75.0000 mg | ORAL_TABLET | Freq: Every day | ORAL | Status: DC
  Administered 2017-05-25 – 2017-05-27 (×3): 75 mg via ORAL
  Filled 2017-05-25 (×4): qty 1

## 2017-05-25 MED ORDER — HEPARIN BOLUS VIA INFUSION
4000.0000 [IU] | Freq: Once | INTRAVENOUS | Status: AC
  Administered 2017-05-25: 4000 [IU] via INTRAVENOUS
  Filled 2017-05-25: qty 4000

## 2017-05-25 MED ORDER — ACETAMINOPHEN 325 MG PO TABS: 650.0000 mg | ORAL_TABLET | Freq: Four times a day (QID) | ORAL | Status: DC | PRN

## 2017-05-25 MED ORDER — ONDANSETRON HCL 4 MG PO TABS: 4.0000 mg | ORAL_TABLET | Freq: Four times a day (QID) | ORAL | Status: DC | PRN

## 2017-05-25 MED ORDER — DILTIAZEM HCL ER COATED BEADS 240 MG PO CP24
240.0000 mg | ORAL_CAPSULE | Freq: Every day | ORAL | Status: DC
  Administered 2017-05-26 – 2017-05-28 (×3): 240 mg via ORAL
  Filled 2017-05-25 (×7): qty 1

## 2017-05-25 MED ORDER — DILTIAZEM HCL-DEXTROSE 100-5 MG/100ML-% IV SOLN (PREMIX)
5.0000 mg/h | Freq: Once | INTRAVENOUS | Status: AC
  Administered 2017-05-25: 5 mg/h via INTRAVENOUS
  Filled 2017-05-25: qty 100

## 2017-05-25 MED ORDER — POTASSIUM CHLORIDE CRYS ER 20 MEQ PO TBCR
40.0000 meq | EXTENDED_RELEASE_TABLET | Freq: Two times a day (BID) | ORAL | Status: AC
  Administered 2017-05-25 – 2017-05-26 (×2): 40 meq via ORAL
  Filled 2017-05-25 (×2): qty 2

## 2017-05-25 NOTE — H&P (Addendum)
Date: 05/25/2017               Patient Name:  Hayden Harmon MRN: [id number]  DOB: [birth year] Age / Sex: 77 y.o., male   PCP: Doneen Poisson, MD         Medical Service: Internal Medicine Teaching Service         Attending Physician: Dr. Sandre Kitty Elwin Mocha, MD    First Contact: Dr. Minda Meo Pager: [phone]  Second Contact: Dr. Vincente Liberty Pager: [phone]       After Hours (After 5p/  First Contact Pager: [phone]  weekends / holidays): Second Contact Pager: [phone]   Chief Complaint: Vomiting, diarrhea  History of Present Illness: 77 yo male with PMH of HTN, gout, and atrial fibrillation not on chronic anticogulation presenting with chief complaint of diarrhea and vomiting for 1 week duration. He states he has had about 3 loose, non-bloody, watery bowel movements daily and has had intermittent episodes of vomiting for one week duration. He is unable to tolerate PO intake and cannot hold anything down, not even water. Denies associated abdominal pain, fevers, or chills. He denies dysphagia or odynophagia. Denies sick contacts or recent antibiotic use. He has not been able to take is oral medications. He denies dysuria or hematuria, but has been urinating much less frequently. Endorses generalized weakness, dizziness, and difficulty with ambulation. States he has lost approximately 14 pounds over the last week. He denies CP, SOB, or syncopal episodes.   Patient was seen in atrial fibrillation clinic and was found to be in afib with RVR, HR 170+ bpm and was sent to the ED. Told NP in afib clinic is has been unable to tolerate PO intake for 3 weeks duration. He was seen in [facility] ED on 3/17 for similar symptoms of N/V/D and was in afib with RVR. He was given IV fluids, zofran, and started on home dilt with improvement in his symptoms. No imaging was done at that time.   ED Course: Vitals: Blood pressure 109/68, pulse (!) 103, temperature (!) 97.5 F, SpO2 100 % on RA. Labs: Na 128, K 3.8, Cl 86, BUN  111, Cr 4.44 (elevated from 1.39); LFTs WNL; WBC 16, Hgb 15.4 (elevated from baseline 11.6), MCV 60.8; UA pending collection Meds: IV dilt 10 mg, zofran, 4 L NS bolus Imaging: CT abdomen pertinent findings - Mild diffuse small bowel thickening without mechanical obstruction compatible with small enteritis; Bilobed aneurysmal dilatation of the left common iliac artery up to 3.3 cm  Meds:  Current Meds  Medication Sig  . allopurinol (ZYLOPRIM) 100 MG tablet Take 2 tablets (200 mg total) by mouth daily.  Marland Kitchen aspirin EC 81 MG tablet Take 81 mg by mouth daily.  . clopidogrel (PLAVIX) 75 MG tablet Take 75 mg by mouth daily.  Marland Kitchen diltiazem (DILT-XR) 240 MG 24 hr capsule Take 1 capsule (240 mg total) by mouth daily.  Marland Kitchen lisinopril (PRINIVIL,ZESTRIL) 2.5 MG tablet Take 1 tablet (2.5 mg total) by mouth daily.     Allergies: Allergies as of 05/25/2017  . (No Known Allergies)   Past Medical History:  Diagnosis Date  . A-fib (HCC)   . CKD (chronic kidney disease)   . Essential hypertension 04/10/2017  . Gout   . History of tobacco use   . Iron deficiency anemia   . MI, old 24   Cath/stent recommended, refused PCI    Family History:  Family history reviewed noncontributory.  Social History:  Social History  Tobacco Use  . Smoking status: Never Smoker  . Smokeless tobacco: Never Used  Substance Use Topics  . Alcohol use: Never    Frequency: Never  . Drug use: Never    Review of Systems: A complete ROS was negative except as per HPI.   Physical Exam: Blood pressure 113/66, pulse (!) 25, temperature (!) 97.5 F (36.4 C), temperature source Oral, resp. rate 19, height 5\' 11"  (1.803 m), weight 197 lb (89.4 kg), SpO2 100 %. Physical Exam  Constitutional: He is oriented to person, place, and time. He appears well-developed and well-nourished.  Non-toxic appearance. No distress.  Actively hiccuping  HENT:  Head: Normocephalic and atraumatic.  Mouth/Throat: Oropharynx is clear and  moist.  Eyes: Conjunctivae are normal. No scleral icterus.  Neck: Normal range of motion. Neck supple.  Cardiovascular: Normal heart sounds. An irregular rhythm present. Tachycardia present.  Pulmonary/Chest: Effort normal and breath sounds normal. No respiratory distress. He has no wheezes.  Abdominal: Soft. Bowel sounds are normal. He exhibits no distension. There is no tenderness.  Musculoskeletal: Normal range of motion. He exhibits no edema.  Neurological: He is alert and oriented to person, place, and time.  Skin: Skin is warm and dry.  Vitals reviewed.   EKG: personally reviewed my interpretation is atrial fibrillation with RVR, HR 176, ST segment depression V3-V5,   CXR: personally reviewed my interpretation is no focal opacity, pleural effusion, or pulmonary edema  Assessment & Plan by Problem: Principal Problem:   Atrial fibrillation with RVR (HCC) Active Problems:   Acute renal failure (HCC)   Enteritis   Hyponatremia   Prostate enlargement  Atrial Fibrillation with RVR Likely secondary to acute illness, dehydration, and inability to take PO diltiazem. Had acute drop in BP after 10 mg of diltiazem administered in the ED. BP improved with fluids, but will need to monitor vitals closely while on cardizem infusion. CHA2DS2-VASC Score 5 with presumed history of stroke as patient is on ASA and plavix even though patient denies stroke history. TSH normal. I-stat troponin negative. -Admit to Step down -Cardiac monitoring -Cardizem gtt; will try PO cardizem dose tonight if patient can tolerate -Started on heparin gtt - will need transition to PO anticoagulation once stabilized  Vomiting, Diarrhea, Enteritis, Weight loss Patient presenting with possibly up to 3 week history of vomiting and diarrhea with greater than 15 lb  weight loss in several weeks. CT with small bowel enteritis. Etiology unclear given mixed history and questionable duration of illness, but seems infectious in  eitiology.  Significant weight loss is a red flag symptoms and concerning. Less concerned about C. Diff given no antibiotic history.  -GI pathogen panel pending  -HIV pending  -blood cx pending  -Supportive care with IVF and zofran  AKI Creatinine 4.4, BUN of 111 likely pre-renal in setting of dehydration and decreased PO intake, s/p 3 liters of IV fluids.  -Monitoring with BMETs -IVF: NS 125 cc/hr x 12 hours  Hyponatremia Na 128, likely secondary to hypovolemia.  -Q4 BMETs -IVF resuscitation   Chronic Microcytic Anemia Hgb elevated to 15 likely secondary to hypovolemia and hemoconcentration. Baseline for patient 12. MCV 60s. Iron studies consistent with iron deficiency anemia.  -CBC in AM  HTN BP soft on admission. -holding lisinopril in setting of AKI and low Bps.  DVT ppx: heparin gtt  Diet: clear liquids, advance as tolerated   Code Status: full code, confirmed on admission   Dispo: Admit patient to Inpatient with expected length of stay greater  than 2 midnights.  Signed: Toney Rakes, MD 05/25/2017, 8:08 PM  Pager: [phone]

## 2017-05-25 NOTE — ED Notes (Signed)
Bladder scan =467ml.

## 2017-05-25 NOTE — Progress Notes (Signed)
Primary Care Physician: Doneen Poisson, MD Referring Physician: [facility] ER f/u   Hayden Harmon is a 77 y.o. male with a h/o HTN, anemia, that was seen in the ER 3/17 with N/V x 4 days and found to have afib with RVR. He was asked to f/u in the afib clinic. Pt is here with afib with rvr of 170+.bpm  He states that he has not been able to keep any food down x 3 weeks, with intermittent diarrhea and has lost 13 lbs. He lives with his brother who is with him today as well as the sister. He is not able to take any of his meds as he vomits them back up.  Today, he denies symptoms of palpitations, chest pain, shortness of breath, orthopnea, PND, lower extremity edema, dizziness, presyncope, syncope, or neurologic sequela. The patient is tolerating medications without difficulties and is otherwise without complaint today.   Past Medical History:  Diagnosis Date  . Essential hypertension 04/10/2017   No past surgical history on file.  Current Outpatient Medications  Medication Sig Dispense Refill  . allopurinol (ZYLOPRIM) 100 MG tablet Take 2 tablets (200 mg total) by mouth daily. (Patient not taking: Reported on 05/25/2017) 180 tablet 3  . aspirin EC 81 MG tablet Take 81 mg by mouth daily.    . clopidogrel (PLAVIX) 75 MG tablet Take 75 mg by mouth daily.    Marland Kitchen diltiazem (DILT-XR) 240 MG 24 hr capsule Take 1 capsule (240 mg total) by mouth daily. (Patient not taking: Reported on 05/25/2017) 90 capsule 3  . ferrous sulfate 325 (65 FE) MG tablet Take 1 tablet (325 mg total) by mouth daily. (Patient not taking: Reported on 05/21/2017) 30 tablet 1  . lisinopril (PRINIVIL,ZESTRIL) 2.5 MG tablet Take 1 tablet (2.5 mg total) by mouth daily. (Patient not taking: Reported on 05/25/2017) 30 tablet 2  . magnesium oxide (MAG-OX) 400 MG tablet Take 400 mg by mouth 2 (two) times daily.    . ondansetron (ZOFRAN ODT) 4 MG disintegrating tablet Take 1 tablet (4 mg total) by mouth every 8 (eight) hours as needed for nausea  or vomiting. (Patient not taking: Reported on 05/25/2017) 6 tablet 0   No current facility-administered medications for this encounter.     No Known Allergies  Social History   Socioeconomic History  . Marital status: Legally Separated    Spouse name: Not on file  . Number of children: Not on file  . Years of education: Not on file  . Highest education level: Not on file  Occupational History  . Not on file  Social Needs  . Financial resource strain: Not on file  . Food insecurity:    Worry: Not on file    Inability: Not on file  . Transportation needs:    Medical: Not on file    Non-medical: Not on file  Tobacco Use  . Smoking status: Never Smoker  . Smokeless tobacco: Never Used  Substance and Sexual Activity  . Alcohol use: Not on file  . Drug use: Not on file  . Sexual activity: Not on file  Lifestyle  . Physical activity:    Days per week: Not on file    Minutes per session: Not on file  . Stress: Not on file  Relationships  . Social connections:    Talks on phone: Not on file    Gets together: Not on file    Attends religious service: Not on file    Active member  of club or organization: Not on file    Attends meetings of clubs or organizations: Not on file    Relationship status: Not on file  . Intimate partner violence:    Fear of current or ex partner: Not on file    Emotionally abused: Not on file    Physically abused: Not on file    Forced sexual activity: Not on file  Other Topics Concern  . Not on file  Social History Narrative  . Not on file    No family history on file.  ROS- All systems are reviewed and negative except as per the HPI above  Physical Exam: Vitals:   05/25/17 1513  BP: (!) 132/56  Pulse: (!) 173  Weight: 191 lb (86.6 kg)  Height: 5\' 11"  (1.803 m)   Wt Readings from Last 3 Encounters:  05/25/17 191 lb (86.6 kg)  05/01/17 214 lb 4.8 oz (97.2 kg)  04/10/17 215 lb 6.4 oz (97.7 kg)    Labs: Lab Results  Component  Value Date   NA 140 05/21/2017   K 3.0 (L) 05/21/2017   CL 103 05/21/2017   CO2 25 05/21/2017   GLUCOSE 148 (H) 05/21/2017   BUN 40 (H) 05/21/2017   CREATININE 1.55 (H) 05/21/2017   CALCIUM 8.0 (L) 05/21/2017   MG 2.0 05/01/2017   No results found for: INR No results found for: CHOL, HDL, LDLCALC, TRIG   GEN- The patient is well appearing, alert and oriented x 3 today.   Head- normocephalic, atraumatic Eyes-  Sclera clear, conjunctiva pink Ears- hearing intact Oropharynx- clear Neck- supple, no JVP Lymph- no cervical lymphadenopathy Lungs- Clear to ausculation bilaterally, normal work of breathing Heart- Rapid, irregular rate and rhythm, no murmurs, rubs or gallops, PMI not laterally displaced GI- soft, NT, ND, + BS Extremities- no clubbing, cyanosis, or edema MS- no significant deformity or atrophy Skin- no rash or lesion Psych- euthymic mood, full affect Neuro- strength and sensation are intact  EKG-afib with RVR in the 170's    Assessment and Plan: 1. N/v/diarrhea over 3 weeks with weight loss  of 13 lbs and  with Afib with rvr He is unable to keep down food or meds, and unable to treat as oupt To the ER for probable admission   Lupita LeashDonna C. Matthew Folksarroll, ANP-C Afib Clinic [facility] [address] [place], [place] [address] [phone]

## 2017-05-25 NOTE — ED Notes (Signed)
EDP notified that Cardizem is being held due to hypotension.

## 2017-05-25 NOTE — ED Triage Notes (Signed)
To ED for eval from afib clinic - for eval of afib rvr. pts family states pt has had n/v for past week and unable to eat, drink, or take meds. Pt alert and oriented. States he just feels sick.

## 2017-05-25 NOTE — Progress Notes (Signed)
ANTICOAGULATION CONSULT NOTE - Initial Consult  Pharmacy Consult for heparin Indication: atrial fibrillation  No Known Allergies  Patient Measurements: Height: 5\' 11"  (180.3 cm) Weight: 197 lb (89.4 kg) IBW/kg (Calculated) : 75.3 Heparin Dosing Weight: 89.4 kg  Vital Signs: Temp: 97.5 F (36.4 C) (03/21 1546) Temp Source: Oral (03/21 1546) BP: 113/66 (03/21 2004) Pulse Rate: 25 (03/21 1915)  Labs: Recent Labs    05/25/17 1609  HGB 15.4  HCT 50.3  PLT 419*  CREATININE 4.44*    Estimated Creatinine Clearance: 14.6 mL/min (A) (by C-G formula based on SCr of 4.44 mg/dL (H)).   Medical History: Past Medical History:  Diagnosis Date  . A-fib (HCC)   . CKD (chronic kidney disease)   . Essential hypertension 04/10/2017  . Gout   . History of tobacco use   . Iron deficiency anemia   . MI, old 942015   Cath/stent recommended, refused PCI    Medications:  See medication history  Assessment: 77 yo man to start heparin for afib.  He was not on anticoagulation PTA.  Baseline Hg and PTLC are normal Goal of Therapy:  Heparin level 0.3-0.7 units/ml Monitor platelets by anticoagulation protocol: Yes   Plan:  Heparin bolus 4000 units and drip at 1300 units/hr Check heparin level and CBC daily while on heparin Monitor for bleeding complications  Kanetra Ho Poteet 05/25/2017,8:15 PM

## 2017-05-25 NOTE — ED Notes (Signed)
Post void residual 535 ml bladder scanned, will insert foley catheter

## 2017-05-25 NOTE — ED Provider Notes (Signed)
[facility] Provider Note   CSN: [id number] Arrival date & time: 05/25/17  1535     History   Chief Complaint Chief Complaint  Patient presents with  . Nausea  . Emesis  . Tachycardia    HPI Hayden Harmon is a 77 y.o. male history of atrial fibrillation not on anticoagulation here presenting with nausea, vomiting, diarrhea, weakness, rapid A. Fib.  Patient has been having 3-4 episodes of daily vomiting.  Patient states that it is nonbilious and nonbloody.  Also about 1-2 episodes of watery diarrhea.  Patient denies any fevers or chills.  Has been urinating less and has been feeling weak all over.  Patient felt so weak today and went to cardiology for follow-up and was noted to be in rapid A. fib with a heart rate of 160s.  He was brought in here for evaluation and for admission.  Patient denies any chest pain currently or shortness of breath. Unclear why he is not on blood thinners currently.   The history is provided by the patient.    Past Medical History:  Diagnosis Date  . A-fib (HCC)   . Essential hypertension 04/10/2017    Patient Active Problem List   Diagnosis Date Noted  . Iron deficiency anemia 04/11/2017  . Essential hypertension 04/10/2017  . Gout 04/10/2017    History reviewed. No pertinent surgical history.     Home Medications    Prior to Admission medications   Medication Sig Start Date End Date Taking? Authorizing Provider  allopurinol (ZYLOPRIM) 100 MG tablet Take 2 tablets (200 mg total) by mouth daily. 05/08/17  Yes Doneen PoissonKlima, Lawrence, MD  aspirin EC 81 MG tablet Take 81 mg by mouth daily.   Yes [provider]  clopidogrel (PLAVIX) 75 MG tablet Take 75 mg by mouth daily.   Yes [provider]  diltiazem (DILT-XR) 240 MG 24 hr capsule Take 1 capsule (240 mg total) by mouth daily. 05/08/17  Yes Doneen PoissonKlima, Lawrence, MD  lisinopril (PRINIVIL,ZESTRIL) 2.5 MG tablet Take 1 tablet (2.5 mg total) by mouth  daily. 04/28/17  Yes Burns SpainButcher, Elizabeth A, MD  ferrous sulfate 325 (65 FE) MG tablet Take 1 tablet (325 mg total) by mouth daily. Patient not taking: Reported on 05/21/2017 05/08/17 06/07/17  Lorenso Courierhundi, Vahini, MD  ondansetron (ZOFRAN ODT) 4 MG disintegrating tablet Take 1 tablet (4 mg total) by mouth every 8 (eight) hours as needed for nausea or vomiting. Patient not taking: Reported on 05/25/2017 05/21/17   Little, Ambrose Finlandachel Morgan, MD    Family History No family history on file.  Social History Social History   Tobacco Use  . Smoking status: Never Smoker  . Smokeless tobacco: Never Used  Substance Use Topics  . Alcohol use: Not on file  . Drug use: Not on file     Allergies   Patient has no known allergies.   Review of Systems Review of Systems  Cardiovascular: Positive for palpitations.  Gastrointestinal: Positive for vomiting.  All other systems reviewed and are negative.    Physical Exam Updated Vital Signs BP 123/82   Pulse 78   Temp (!) 97.5 F (36.4 C) (Oral)   Resp (!) 21   Ht 5\' 11"  (1.803 m)   Wt 89.4 kg (197 lb)   SpO2 98%   BMI 27.48 kg/m   Physical Exam  Constitutional: He is oriented to person, place, and time.  Chronically ill, dehydrated   HENT:  Head: Normocephalic.  MM dry  Eyes: Pupils are equal, round, and reactive to light. Conjunctivae and EOM are normal.  Neck: Normal range of motion. Neck supple.  Cardiovascular:  Tachycardic, irregular   Pulmonary/Chest: Effort normal and breath sounds normal. No stridor. No respiratory distress. He has no wheezes.  Abdominal: Soft. Bowel sounds are normal. He exhibits no distension. There is no tenderness. There is no guarding.  Musculoskeletal: Normal range of motion.  Neurological: He is alert and oriented to person, place, and time. No cranial nerve deficit. Coordination normal.  Skin: Skin is warm.  Psychiatric: He has a normal mood and affect.  Nursing note and vitals reviewed.    ED Treatments /  Results  Labs (all labs ordered are listed, but only abnormal results are displayed) Labs Reviewed  CBC WITH DIFFERENTIAL/PLATELET - Abnormal; Notable for the following components:      Result Value   WBC 16.0 (*)    RBC 8.27 (*)    MCV 60.8 (*)    MCH 18.6 (*)    RDW 21.8 (*)    Platelets 419 (*)    All other components within normal limits  COMPREHENSIVE METABOLIC PANEL - Abnormal; Notable for the following components:   Sodium 128 (*)    Chloride 86 (*)    Glucose, Bld 217 (*)    BUN 111 (*)    Creatinine, Ser 4.44 (*)    Calcium 8.6 (*)    Total Protein 8.3 (*)    ALT 12 (*)    GFR calc non Af Amer 12 (*)    GFR calc Af Amer 13 (*)    Anion gap 20 (*)    All other components within normal limits  LIPASE, BLOOD  URINALYSIS, ROUTINE W REFLEX MICROSCOPIC  I-STAT TROPONIN, ED    EKG  EKG Interpretation None       EKG Interpretation  Date/Time:  Thursday May 25 2017 15:45:22 EDT Ventricular Rate:  176 PR Interval:    QRS Duration: 92 QT Interval:  238 QTC Calculation: 407 R Axis:   3 Text Interpretation:  Atrial fibrillation with rapid ventricular response Marked ST abnormality, possible inferior subendocardial injury Abnormal ECG rate related changes  Confirmed by Hayden Harmon ([id number]) on 05/25/2017 5:29:06 PM        Radiology Dg Chest Port 1 View  Result Date: 05/25/2017 CLINICAL DATA:  Altered mental status. EXAM: PORTABLE CHEST 1 VIEW COMPARISON:  None. FINDINGS: The heart size and mediastinal contours are within normal limits. Both lungs are clear. The visualized skeletal structures are unremarkable. IMPRESSION: No active disease. Electronically Signed   By: Hayden Dredge M.D.   On: 05/25/2017 16:51   Dg Abd Portable 1 View  Result Date: 05/25/2017 CLINICAL DATA:  Altered mental status.  Vomiting. EXAM: PORTABLE ABDOMEN - 1 VIEW COMPARISON:  None. FINDINGS: There are a few mildly dilated loops of small bowel, some of which demonstrate wall thickening.  Air is seen throughout the colon. Small stool ball in the rectum. No radiopaque calculi. Degenerative changes of the lower lumbar spine. IMPRESSION: 1. Few mildly dilated loops of small bowel with wall thickening may reflect underlying enteritis. No definite evidence of obstruction. 2. Small stool ball in the rectum. Electronically Signed   By: Hayden Dredge M.D.   On: 05/25/2017 16:53    Procedures Procedures (including critical care time)  CRITICAL CARE Performed by: Hayden Harmon   Total critical care time: 30 minutes  Critical care time was exclusive of separately billable procedures and treating  other patients.  Critical care was necessary to treat or prevent imminent or life-threatening deterioration.  Critical care was time spent personally by me on the following activities: development of treatment plan with patient and/or surrogate as well as nursing, discussions with consultants, evaluation of patient's response to treatment, examination of patient, obtaining history from patient or surrogate, ordering and performing treatments and interventions, ordering and review of laboratory studies, ordering and review of radiographic studies, pulse oximetry and re-evaluation of patient's condition.   Medications Ordered in ED Medications  sodium chloride 0.9 % bolus 1,000 mL (has no administration in time range)  diltiazem (CARDIZEM) 100 mg in dextrose 5% (1 mg/mL) infusion (has no administration in time range)  sodium chloride 0.9 % bolus 1,000 mL (1,000 mLs Intravenous New Bag/Given 05/25/17 1701)  diltiazem (CARDIZEM) injection 10 mg (10 mg Intravenous Given 05/25/17 1701)  ondansetron (ZOFRAN) injection 4 mg (4 mg Intravenous Given 05/25/17 1703)     Initial Impression / Assessment and Plan / ED Course  I have reviewed the triage vital signs and the nursing notes.  Pertinent labs & imaging results that were available during my care of the patient were reviewed by me and  considered in my medical decision making (see chart for details).    Hayden Harmon is a 77 y.o. male here with vomiting, diarrhea, weakness, rapid afib. I think likely gastroenteritis and dehydration causing afib. Patient has no chest pain or SOB, just diffusely weak. He is not on anticoagulation and I am not sure why (he recently moved here from [place] and wasn't on it in [place]). Will get labs, lipase. Will give IVF and cardizem.   5:30 pm  Labs showed WBC 16. He has AKI with Cr 4.4, Na 128. Xray showed enteritis. HR improved to the 120s after cardizem bolus. Ordered second liter NS bolus. Started on cardizem drip. I think rapid afib likely secondary to renal failure. CT renal stone ordered to evaluate for etiology of renal failure and is pending. Internal medicine teaching service to admit for AKI, rapid afib, hyponatremia.   7:10 PM CT showed no obvious SBO or hydro. BP upper 90s now. Getting second NS bolus and will start cardizem drip. Internal medicine teaching service to admit to stepdown.       Final Clinical Impressions(s) / ED Diagnoses   Final diagnoses:  None    ED Discharge Orders    None       Charlynne Pander, MD 05/25/17 1911

## 2017-05-25 NOTE — ED Notes (Signed)
SCANNER BROKEN IN ROOM 26. Nurse Secretary notified to place work order.

## 2017-05-26 ENCOUNTER — Ambulatory Visit: Payer: Medicare Other

## 2017-05-26 ENCOUNTER — Other Ambulatory Visit: Payer: Self-pay

## 2017-05-26 DIAGNOSIS — R634 Abnormal weight loss: Secondary | ICD-10-CM

## 2017-05-26 DIAGNOSIS — E861 Hypovolemia: Secondary | ICD-10-CM

## 2017-05-26 DIAGNOSIS — E860 Dehydration: Secondary | ICD-10-CM

## 2017-05-26 DIAGNOSIS — N179 Acute kidney failure, unspecified: Secondary | ICD-10-CM

## 2017-05-26 DIAGNOSIS — D509 Iron deficiency anemia, unspecified: Secondary | ICD-10-CM

## 2017-05-26 DIAGNOSIS — Z7902 Long term (current) use of antithrombotics/antiplatelets: Secondary | ICD-10-CM

## 2017-05-26 DIAGNOSIS — K529 Noninfective gastroenteritis and colitis, unspecified: Secondary | ICD-10-CM

## 2017-05-26 DIAGNOSIS — Z7982 Long term (current) use of aspirin: Secondary | ICD-10-CM

## 2017-05-26 DIAGNOSIS — I10 Essential (primary) hypertension: Secondary | ICD-10-CM

## 2017-05-26 DIAGNOSIS — Z79899 Other long term (current) drug therapy: Secondary | ICD-10-CM

## 2017-05-26 DIAGNOSIS — E871 Hypo-osmolality and hyponatremia: Secondary | ICD-10-CM

## 2017-05-26 DIAGNOSIS — I482 Chronic atrial fibrillation: Secondary | ICD-10-CM

## 2017-05-26 LAB — CBC
HCT: 38.5 % — ABNORMAL LOW (ref 39.0–52.0)
Hemoglobin: 11.6 g/dL — ABNORMAL LOW (ref 13.0–17.0)
MCH: 18.5 pg — ABNORMAL LOW (ref 26.0–34.0)
MCHC: 30.1 g/dL (ref 30.0–36.0)
MCV: 61.4 fL — ABNORMAL LOW (ref 78.0–100.0)
Platelets: 301 10*3/uL (ref 150–400)
RBC: 6.27 MIL/uL — ABNORMAL HIGH (ref 4.22–5.81)
RDW: 21.3 % — ABNORMAL HIGH (ref 11.5–15.5)
WBC: 10.5 10*3/uL (ref 4.0–10.5)

## 2017-05-26 LAB — BASIC METABOLIC PANEL
Anion gap: 13 (ref 5–15)
Anion gap: 10 (ref 5–15)
BUN: 81 mg/dL — ABNORMAL HIGH (ref 6–20)
BUN: 64 mg/dL — ABNORMAL HIGH (ref 6–20)
CO2: 20 mmol/L — ABNORMAL LOW (ref 22–32)
CO2: 21 mmol/L — ABNORMAL LOW (ref 22–32)
Calcium: 7.2 mg/dL — ABNORMAL LOW (ref 8.9–10.3)
Calcium: 7.3 mg/dL — ABNORMAL LOW (ref 8.9–10.3)
Chloride: 98 mmol/L — ABNORMAL LOW (ref 101–111)
Chloride: 99 mmol/L — ABNORMAL LOW (ref 101–111)
Creatinine, Ser: 2.5 mg/dL — ABNORMAL HIGH (ref 0.61–1.24)
Creatinine, Ser: 2.02 mg/dL — ABNORMAL HIGH (ref 0.61–1.24)
GFR calc Af Amer: 27 mL/min — ABNORMAL LOW (ref >60)
GFR calc Af Amer: 35 mL/min — ABNORMAL LOW (ref >60)
GFR calc non Af Amer: 23 mL/min — ABNORMAL LOW (ref >60)
GFR calc non Af Amer: 30 mL/min — ABNORMAL LOW (ref >60)
Glucose, Bld: 92 mg/dL (ref 65–99)
Glucose, Bld: 92 mg/dL (ref 65–99)
Potassium: 3.6 mmol/L (ref 3.5–5.1)
Potassium: 3.8 mmol/L (ref 3.5–5.1)
Sodium: 131 mmol/L — ABNORMAL LOW (ref 135–145)
Sodium: 130 mmol/L — ABNORMAL LOW (ref 135–145)

## 2017-05-26 LAB — HEPARIN LEVEL (UNFRACTIONATED)
Heparin Unfractionated: 0.32 IU/mL (ref 0.30–0.70)
Heparin Unfractionated: 0.13 IU/mL — ABNORMAL LOW (ref 0.30–0.70)

## 2017-05-26 MED ORDER — HEPARIN SODIUM (PORCINE) 5000 UNIT/ML IJ SOLN
5000.0000 [IU] | Freq: Three times a day (TID) | INTRAMUSCULAR | Status: DC
  Administered 2017-05-26 – 2017-05-31 (×15): 5000 [IU] via SUBCUTANEOUS
  Filled 2017-05-26 (×15): qty 1

## 2017-05-26 NOTE — Progress Notes (Signed)
PT Cancellation Note  Patient Details Name: Hayden Harmon MRN: [id number] DOB: 01-16-1941   Cancelled Treatment:    Reason Eval/Treat Not Completed: Active bedrest order. Please discontinue bedrest order for PT/OT evaluations. Will follow-up tomorrow.  Hayden Harmon, PT, DPT Acute Rehab Services  Pager: [phone]  Hayden Harmon 05/26/2017, 3:43 PM

## 2017-05-26 NOTE — Progress Notes (Signed)
ANTICOAGULATION CONSULT NOTE - Follow Up Consult  Pharmacy Consult for heparin Indication: atrial fibrillation  Labs: Recent Labs    05/25/17 1609 05/25/17 1947 05/25/17 2319 05/26/17 0454  HGB 15.4  --   --  11.6*  HCT 50.3  --   --  38.5*  PLT 419*  --   --  PENDING  HEPARINUNFRC  --   --   --  0.32  CREATININE 4.44* 3.32* 3.14* 2.50*     Assessment: 77yo male therapeutic on heparin though at low end of goal.  Goal of Therapy:  Heparin level 0.3-0.7 units/ml   Plan:  Will increase heparin gtt slightly to 1400 units/hr and check level in 8 hours.    Vernard GamblesVeronda Danna Sewell, PharmD, BCPS  05/26/2017,5:53 AM

## 2017-05-26 NOTE — Progress Notes (Addendum)
  Date: 05/26/2017  Patient name: Hayden Harmon ClientJohn N Pippins  Medical record number: [id number]  Date of birth: 01-Apr-1940   I have seen and evaluated this patient and I have discussed the plan of care with the house staff. Please see their note for complete details. I concur with their findings with the following additions/corrections:   77 year old male with a history of A. fib presenting with vomiting and diarrhea. Duration of symptoms is difficult to elicit from him, as he has reported a range of 1-3 weeks of symptoms. No hematemesis, no hematochezia. He reports not been able to hold anything down prior to admission. No abdominal pain.  On presentation, he appeared dehydrated and was in A. fib with RVR. Administration of diltiazem initially dropped his blood pressure, but since then he has been maintaining his blood pressure with improved rate control, still in A. fib. He now appears euvolemic after fluid resuscitation. He reports he was able to keep down a small amount of breakfast.  1. Vomiting and diarrhea: Most likely infectious, GI pathogen panel pending, but no diarrhea since admission. CT shows enteritis. At this point, likely no role for empiric anabiotics -Adequately volume resuscitated, good urine output, maintenance fluids until good oral intake -Follow-up GI panel  2. AKI: Likely prerenal due to hypovolemia, volume resuscitation as above and trending creatinine -Avoid nephrotoxins, hold aspirin  3. A. fib with RVR: Likely due to hypovolemia and missing medications, rate control improved -Continue home diltiazem, target heart rate less than 110  4. Hypovolemic hyponatremia, improving with volume resuscitation  Jessy OtoAlexander Raines, M.D., Ph.D. 05/26/2017, 5:43 PM

## 2017-05-26 NOTE — Progress Notes (Signed)
   Subjective:  Patient seen and examined. States he has had no vomiting for diarrhea since arrival to the hospital.  He was able to tolerate breakfast this morning without N/V.    Objective:  Vital signs in last 24 hours: Vitals:   05/26/17 0615 05/26/17 0645 05/26/17 0700 05/26/17 0830  BP: 103/79 103/62 96/65 102/69  Pulse: (!) 55 70 85 76  Resp: 18 15 13 16   Temp:      TempSrc:      SpO2: 100% 99% 100% 98%  Weight:      Height:       General: Laying in bed comfortably, NAD HEENT: [place]/AT, no scleral icterus,  Cardiac: Rate normal, irregular rhythm, No R/M/G appreciated Pulm: normal effort, CTAB Abd: soft, non tender, non distended, BS normal Ext: extremities well perfused, no peripheral edema Neuro: alert and oriented X3, cranial nerves II-XII grossly intact   Assessment/Plan:  Principal Problem:   Atrial fibrillation with RVR (HCC) Active Problems:   Acute renal failure (HCC)   Enteritis   Hyponatremia   Prostate enlargement   Aortic atherosclerosis (HCC)  Atrial Fibrillation with RVR Patient remains in afib, currently rate controlled. Rates improved and have normalized, now rate controlled on PO diltiazem. Likely secondary to acute illness, dehydration, and inability to take PO diltiazem. CHA2DS2-VASC Score 5 with presumed history of stroke as patient is on ASA and plavix even though patient denies stroke history, score of 3 w/o stroke hx. -Transfer for tele - no longer requires step down -Cardiac monitoring -Cardizem 240 mg daily -Still on heparin gtt - will transition to ppx [place] heparin  Vomiting, Diarrhea, Enteritis, Weight loss Patient presenting with possibly up to 2-3 week history of vomiting and diarrhea with greater than 15 lb  weight loss in several weeks. CT with small bowel enteritis. Etiology unclear given mixed history and questionable duration of illness, but seems infectious in eitiology.  Significant weight loss is a red flag symptoms and  concerning. He has had [facility] diarrhea or [facility]  -GI pathogen panel pending - has had no bowel movement -HIV pending  -blood cx pending  -Supportive care with zofran -Has received > 4 liters of IVF resuscitation - patient tolerating PO as of now, will hold off on IV fluids and continue to monitor  -Will continue enteric precautions until no diarrhea/vomiting for ~ 1 day  AKI Improving. Creatinine 4.4>>2.5, BUN of 111>>81; AKI pre-renal in setting of dehydration and decreased PO intake, s/p 4 liters of IV fluids.  -Monitoring with BMETs  Hyponatremia Na 128>>131, likely secondary to hypovolemia. Resolved with IV fluid resuscitation.   -Repeat BMET this afternoon  Chronic Microcytic Anemia  Hgb at baseline of 11.6  MCV 60s. Iron studies consistent with iron deficiency anemia. No evidence of acute blood loss. -CBC in AM  HTN BP soft on admission. -holding lisinopril in setting of AKI and low Bps.  DVT ppx: [place] heparin  Diet: soft, advance as tolerated   Code Status: full code, confirmed on admission  Dispo: Anticipated discharge in approximately 1-2 day(s).   Hayden Harmon, Hayden Delima J, MD 05/26/2017, 11:40 AM Pager: [phone]

## 2017-05-26 NOTE — Progress Notes (Signed)
05/26/2017 1340 Received pt to room 4E-26 from ED.  Pt is A&O, no C/O voiced.  Tele monitor applied and CCMD notified.  CHG bath done.  Oriented to room, call light and bed.  Call bell in reach.   Kathryne HitchAllen, Lyndell Gillyard C

## 2017-05-26 NOTE — ED Notes (Signed)
Pt has had NO diarrhea since arrival to ED will contact MD regarding enteric precautions

## 2017-05-27 LAB — BASIC METABOLIC PANEL
Anion gap: 10 (ref 5–15)
BUN: 43 mg/dL — ABNORMAL HIGH (ref 6–20)
CO2: 24 mmol/L (ref 22–32)
Calcium: 7.8 mg/dL — ABNORMAL LOW (ref 8.9–10.3)
Chloride: 98 mmol/L — ABNORMAL LOW (ref 101–111)
Creatinine, Ser: 1.66 mg/dL — ABNORMAL HIGH (ref 0.61–1.24)
GFR calc Af Amer: 44 mL/min — ABNORMAL LOW (ref >60)
GFR calc non Af Amer: 38 mL/min — ABNORMAL LOW (ref >60)
Glucose, Bld: 104 mg/dL — ABNORMAL HIGH (ref 65–99)
Potassium: 4.3 mmol/L (ref 3.5–5.1)
Sodium: 132 mmol/L — ABNORMAL LOW (ref 135–145)

## 2017-05-27 LAB — CBC
HCT: 39.5 % (ref 39.0–52.0)
Hemoglobin: 11.9 g/dL — ABNORMAL LOW (ref 13.0–17.0)
MCH: 18.9 pg — ABNORMAL LOW (ref 26.0–34.0)
MCHC: 30.1 g/dL (ref 30.0–36.0)
MCV: 62.6 fL — ABNORMAL LOW (ref 78.0–100.0)
Platelets: 295 10*3/uL (ref 150–400)
RBC: 6.31 MIL/uL — ABNORMAL HIGH (ref 4.22–5.81)
RDW: 21.8 % — ABNORMAL HIGH (ref 11.5–15.5)
WBC: 8.3 10*3/uL (ref 4.0–10.5)

## 2017-05-27 MED ORDER — PANTOPRAZOLE SODIUM 40 MG PO TBEC
40.0000 mg | DELAYED_RELEASE_TABLET | Freq: Every day | ORAL | Status: DC
  Administered 2017-05-27: 40 mg via ORAL
  Filled 2017-05-27 (×2): qty 1

## 2017-05-27 MED ORDER — SUCRALFATE 1 GM/10ML PO SUSP
1.0000 g | Freq: Three times a day (TID) | ORAL | Status: DC
  Filled 2017-05-27: qty 10

## 2017-05-27 MED ORDER — PROMETHAZINE HCL 25 MG/ML IJ SOLN
12.5000 mg | Freq: Four times a day (QID) | INTRAMUSCULAR | Status: DC | PRN
  Administered 2017-05-28: 12.5 mg via INTRAVENOUS
  Filled 2017-05-27 (×2): qty 1

## 2017-05-27 MED ORDER — GI COCKTAIL ~~LOC~~: 30.0000 mL | Freq: Three times a day (TID) | ORAL | Status: DC | PRN

## 2017-05-27 NOTE — Evaluation (Signed)
Occupational Therapy Evaluation Patient Details Name: Hayden Harmon ClientJohn N Halberstam MRN: [id number] DOB: 25-Jun-1940 Today's Date: 05/27/2017    History of Present Illness Pt is a 77 y.o. male admitted 05/25/17 with possibly up to 2-3 week h/o vomiting and diarrhea with >15 lb  weight loss in several weeks. Found to be in a-fib with RVR; also with AKI. CT with small bowel enteritis. PMH includes chronic microcytic anemia, HTN, CKD, a-fib, gout.   Clinical Impression   PTA Pt was mod I for ADL and mobility. Eval limited this session, Pt able to perform bed mobility with increased time and use of bed rail, min A with hand held assist for adjusting hips up the bed (not quite sit <>stand) for positioning. Overall at this time due to pain and nausea and balance deficits Pt will require mod A for LB ADL  And will benefit from skilled OT in the acute setting prior to dc to SNF to maximize safety and independence in ADL and mobility. Pt likely to decline SNF, and this is concerning as today he presents with little insight into his deficits and safety risks. Hopeful that this patient will be more functional and have improved independence when nausea/pain is better controlled.     Follow Up Recommendations  SNF;Supervision/Assistance - 24 hour    Equipment Recommendations  Other (comment)(defer to next venue)    Recommendations for Other Services       Precautions / Restrictions Precautions Precautions: Fall Restrictions Weight Bearing Restrictions: No      Mobility Bed Mobility Overal bed mobility: Modified Independent             General bed mobility comments: Increased time and effort  Transfers Overall transfer level: Needs assistance Equipment used: 1 person hand held assist Transfers: Sit to/from Stand Sit to Stand: Min assist         General transfer comment: min A to stand and take small steps up the side of the bed. Pt complaining about ankle pain    Balance Overall balance  assessment: Needs assistance Sitting-balance support: Bilateral upper extremity supported;Feet supported Sitting balance-Leahy Scale: Fair Sitting balance - Comments: EOB to adjust socks   Standing balance support: Bilateral upper extremity supported Standing balance-Leahy Scale: Poor Standing balance comment: no DME used, reliant on external support                           ADL either performed or assessed with clinical judgement   ADL Overall ADL's : Needs assistance/impaired Eating/Feeding: Modified independent   Grooming: Set up;Sitting   Upper Body Bathing: Minimal assistance   Lower Body Bathing: Moderate assistance   Upper Body Dressing : Min guard   Lower Body Dressing: Moderate assistance Lower Body Dressing Details (indicate cue type and reason): able to reach down to feet to adust socks, but for sit to stand items like underwear or pants Pt will require mod A Toilet Transfer: Minimal assistance;Stand-pivot   Toileting- Clothing Manipulation and Hygiene: Set up       Functional mobility during ADLs: Rolling walker;Minimal assistance General ADL Comments: Pt with limited engagement throughout session due to complaints of nausea and pain in ankle     Vision         Perception     Praxis      Pertinent Vitals/Pain Pain Assessment: Faces Faces Pain Scale: Hurts a little bit Pain Location: L ankle Pain Descriptors / Indicators: Heaviness Pain Intervention(s): Monitored during session;Limited  activity within patient's tolerance     Hand Dominance Right   Extremity/Trunk Assessment Upper Extremity Assessment Upper Extremity Assessment: Generalized weakness   Lower Extremity Assessment Lower Extremity Assessment: Generalized weakness       Communication Communication Communication: No difficulties   Cognition Arousal/Alertness: Awake/alert Behavior During Therapy: Flat affect;Agitated Overall Cognitive Status: No family/caregiver  present to determine baseline cognitive functioning Area of Impairment: Safety/judgement;Problem solving;Following commands                       Following Commands: Follows one step commands inconsistently Safety/Judgement: Decreased awareness of safety;Decreased awareness of deficits   Problem Solving: Requires verbal cues General Comments: Pt reluctant to participate in evaluation. Overall agitated due to nausea and unhappiness.   General Comments  Pt overall frustrated with situation, and reluctant to work with OT.    Exercises     Shoulder Instructions      Home Living Family/patient expects to be discharged to:: Private residence Living Arrangements: Other relatives(brother) Available Help at Discharge: Family;Available 24 hours/day Type of Home: House Home Access: Stairs to enter [employer] of Steps: 1 Entrance Stairs-Rails: None Home Layout: One level     Bathroom Shower/Tub: Tub/shower unit         Home Equipment: Environmental consultant - 2 wheels;Cane - single point          Prior Functioning/Environment Level of Independence: Independent with assistive device(s)        Comments: Pt reports mod indep with SPC and for ADL. Does not drives; brother provides transport        OT Problem List: Decreased strength;Decreased range of motion;Decreased activity tolerance;Impaired balance (sitting and/or standing);Decreased safety awareness;Pain      OT Treatment/Interventions: Self-care/ADL training;Energy conservation;DME and/or AE instruction;Manual therapy;Therapeutic activities;Patient/family education;Balance training    OT Goals(Current goals can be found in the care plan section) Acute Rehab OT Goals Patient Stated Goal: Return home OT Goal Formulation: With patient Time For Goal Achievement: 06/10/17 Potential to Achieve Goals: Fair ADL Goals Pt Will Perform Grooming: with supervision;standing Pt Will Perform Upper Body Bathing: with modified  independence;sitting Pt Will Perform Lower Body Bathing: with modified independence;sit to/from stand Pt Will Transfer to Toilet: with supervision;ambulating;regular height toilet Pt Will Perform Toileting - Clothing Manipulation and hygiene: with modified independence;sit to/from stand Additional ADL Goal #1: Pt will recall 3 ways of conserving energy during ADL with 2 or less verbal cues  OT Frequency: Min 2X/week   Barriers to D/C:    unsure of brother's ability/availability to assist as he was not present       Co-evaluation              AM-PAC PT "6 Clicks" Daily Activity     Outcome Measure Help from another person eating meals?: None Help from another person taking care of personal grooming?: A Little(none in sitting) Help from another person toileting, which includes using toliet, bedpan, or urinal?: A Little Help from another person bathing (including washing, rinsing, drying)?: A Lot Help from another person to put on and taking off regular upper body clothing?: A Little Help from another person to put on and taking off regular lower body clothing?: A Lot 6 Click Score: 17   End of Session Nurse Communication: Mobility status  Activity Tolerance: Patient limited by pain;Treatment limited secondary to agitation Patient left: in bed;with call bell/phone within reach;with nursing/sitter in room  OT Visit Diagnosis: Unsteadiness on feet (R26.81);Other abnormalities of gait and  mobility (R26.89);Muscle weakness (generalized) (M62.81);Pain Pain - Right/Left: Left Pain - part of body: Ankle and joints of foot                Time: 1459-1520 OT Time Calculation (min): 21 min Charges:  OT General Charges $OT Visit: 1 Visit OT Evaluation $OT Eval Moderate Complexity: 1 Mod G-Codes:     Sherryl Manges OTR/L [phone]  Evern Bio Shantele Reller 05/27/2017, 4:37 PM

## 2017-05-27 NOTE — Progress Notes (Signed)
No bowel movement on this admission. Stool sample not collected. Continues to feel sick and vomiting times 2 relieved with Zofran. Provider paged

## 2017-05-27 NOTE — Progress Notes (Signed)
Pt has had nausea for several weeks.  Per pt he only had diarrhea Thursday 3/21 for less than half of day. Pt has not been able to tolerate food or drink.  Pt will rest and attempt to give oral medications once stomach is settled. Pt resting with call bell within reach.  Will continue to monitor.

## 2017-05-27 NOTE — Progress Notes (Signed)
   Subjective:  Patient seen and examined at bedside. Pt/overnight RN reported 2 episodes of "emesis" but sounds more like he regurgitated his water. He continues to complain of nausea and hasn't tolerated food or drink today.   Objective:  Vital signs in last 24 hours: Vitals:   05/26/17 1200 05/26/17 1343 05/26/17 2002 05/27/17 0509  BP: (!) 102/52 94/69 107/66 115/85  Pulse: 79 81 67 83  Resp: 16 15 15 14   Temp:  98 F (36.7 C) 98.5 F (36.9 C) 98.2 F (36.8 C)  TempSrc:  Oral Oral Oral  SpO2: 99% 99% 100% 99%  Weight:  197 lb 1.5 oz (89.4 kg)  194 lb 14.2 oz (88.4 kg)  Height:  5\' 11"  (1.803 m)     General: Laying in bed comfortably, NAD HEENT: [place]/AT, no scleral icterus,  Cardiac: Rate normal, irregular rhythm, No R/M/G appreciated Pulm: normal effort, CTAB Abd: soft, non tender, non distended, BS normal Ext: extremities well perfused, no peripheral edema Neuro: alert and oriented X3, cranial nerves II-XII grossly intact   Assessment/Plan:  Principal Problem:   Atrial fibrillation with RVR (HCC) Active Problems:   Acute renal failure (HCC)   Enteritis   Hyponatremia   Prostate enlargement   Aortic atherosclerosis (HCC)  Atrial Fibrillation with RVR Likely secondary to hypovolemia as well as not being able to tolerate PO medications at home. He remains in atrial fibrillation but with controlled rates. CHA2DS2-VASC Score 5 with presumed history of stroke as patient is on ASA and plavix even though patient denies stroke history, score of 3 w/o stroke hx. -Cardiac monitoring -Cardizem 240 mg daily -SQ heparin  Vomiting, Diarrhea, Small Bowel Enteritis No further diarrhea since admission, GI pathogen panel unable to be collected yet due to lack of BMs. Still continues to complain of nausea and vomiting. Etiology unclear, could be infectious although given its persistence and weight loss, I wonder if patient might benefit from EGD.   -GI pathogen panel pending - has  had no bowel movement -HIV pending  -blood cx pending  -Supportive care with zofran -Has received > 4 liters of IVF resuscitation, will monitor UOP and start IVF if indicated -Will continue enteric precautions until no diarrhea/vomiting for ~ 1 day  AKI Renal function continues to improve and is most consistent with AKI in setting of dehydration/poor PO intake. Received several liters of IVF. Will continue to monitor UOP, if declines, will add maintenance fluids given his poor PO intake. -Monitoring with BMETs  Hyponatremia Na 128>>132, likely secondary to hypovolemia. Improving with IV fluid.  Will continue to monitor.   Chronic Microcytic Anemia Hgb at baseline of 11.9  MCV 60s. Iron studies consistent with iron deficiency anemia. No evidence of acute blood loss.  -He might benefit from parenteral iron while in hospital, will arrange -CBC in AM  HTN BP soft on admission. -holding lisinopril in setting of AKI and low Bps.  DVT ppx: [place] heparin  Diet: soft, advance as tolerated   Code Status: full code, confirmed on admission  Dispo: Anticipated discharge in approximately 1-2 day(s).   Zarek Relph, DO 05/27/2017, 7:08 AM Pager: [phone]

## 2017-05-27 NOTE — Evaluation (Signed)
Physical Therapy Evaluation Patient Details Name: Hayden Harmon MRN: [id number] DOB: [birth year] Today's Date: 05/27/2017   History of Present Illness  Pt is a 77 y.o. male admitted 05/25/17 with possibly up to 2-3 week h/o vomiting and diarrhea with >15 lb  weight loss in several weeks. Found to be in a-fib with RVR; also with AKI. CT with small bowel enteritis. PMH includes chronic microcytic anemia, HTN, CKD, a-fib, gout.    Clinical Impression  Pt presents with an overall decrease in functional mobility secondary to above. PTA, pt reports mod indep with SPC and lives with brother; does not drive. Today pt sitting in chair and required max encouragement to mobilize with PT secondary to c/o nausea. Only willing to amb short distance in room, requiring RW and minA to prevent 1x LOB; unable to stand without minA. Pt demonstrating poor safety awareness (unsure if baseline cognition or personality?). Discussed recommendation for short term SNF-level therapies to maximize functional mobility and independence prior to return home; pt refusing. Will follow acutely to address established goals.   HR 71-87 a-fib    Follow Up Recommendations SNF;Supervision/Assistance - 24 hour    Equipment Recommendations  None recommended by PT    Recommendations for Other Services OT consult     Precautions / Restrictions Precautions Precautions: Fall Restrictions Weight Bearing Restrictions: No      Mobility  Bed Mobility Overal bed mobility: Modified Independent             General bed mobility comments: Increased time and effort  Transfers Overall transfer level: Needs assistance Equipment used: Rolling walker (2 wheeled) Transfers: Sit to/from Stand Sit to Stand: Min assist         General transfer comment: After waiting >5 min for pt to "prepare to stand," able to stand on 3rd attempt with RW and minA for trunk elevation. Poor eccentric control when sitting on  bed  Ambulation/Gait Ambulation/Gait assistance: Min assist Ambulation Distance (Feet): 10 Feet Assistive device: Rolling walker (2 wheeled) Gait Pattern/deviations: Step-to pattern;Trunk flexed;Drifts right/left Gait velocity: Decreased Gait velocity interpretation: <1.8 ft/sec, indicative of risk for recurrent falls General Gait Details: Slow, uncontrolled ambulation with RW, requiring minA for balance and to prevent 1x LOB. Pt with poor safety awareness using RW, keeping it very far from body and not responding to cues/instruction for closer proximity. Impulsive with movement as well  Stairs            Wheelchair Mobility    Modified Rankin (Stroke Patients Only)       Balance Overall balance assessment: Needs assistance   Sitting balance-Leahy Scale: Fair       Standing balance-Leahy Scale: Poor Standing balance comment: 1x LOB with RW requiring minA to correct                             Pertinent Vitals/Pain Pain Assessment: Faces Faces Pain Scale: Hurts a little bit Pain Location: L ankle Pain Descriptors / Indicators: Heaviness Pain Intervention(s): Monitored during session;Limited activity within patient's tolerance    Home Living Family/patient expects to be discharged to:: Private residence Living Arrangements: Other relatives(Brother) Available Help at Discharge: Family;Available 24 hours/day Type of Home: House Home Access: Stairs to enter Entrance Stairs-Rails: None Entrance Stairs-Number of Steps: 1 Home Layout: One level Home Equipment: Walker - 2 wheels;Cane - single point      Prior Function Level of Independence: Independent with assistive device(s)  Comments: Pt reports mod indep with SPC. Does not drives; brother provides transport     Higher education careers adviserHand Dominance        Extremity/Trunk Assessment   Upper Extremity Assessment Upper Extremity Assessment: Generalized weakness    Lower Extremity Assessment Lower  Extremity Assessment: Generalized weakness       Communication      Cognition Arousal/Alertness: Awake/alert Behavior During Therapy: Flat affect Overall Cognitive Status: No family/caregiver present to determine baseline cognitive functioning Area of Impairment: Safety/judgement;Problem solving;Following commands                       Following Commands: Follows one step commands inconsistently Safety/Judgement: Decreased awareness of safety;Decreased awareness of deficits   Problem Solving: Requires verbal cues General Comments: Waiting for at least 5 minutes standing next to patient while he "prepared to stand"; when I encouraged him to mobilize, stated "I won't be rushed by you." Very poor safety navigating RW, not responding to cues (unsure baseline cognition vs. stubborness)      General Comments      Exercises     Assessment/Plan    PT Assessment Patient needs continued PT services  PT Problem List Decreased strength;Decreased activity tolerance;Decreased balance;Decreased mobility;Decreased cognition;Decreased knowledge of use of DME;Decreased safety awareness       PT Treatment Interventions DME instruction;Gait training;Stair training;Functional mobility training;Therapeutic activities;Therapeutic exercise;Patient/family education    PT Goals (Current goals can be found in the Care Plan section)  Acute Rehab PT Goals Patient Stated Goal: Return home PT Goal Formulation: With patient Time For Goal Achievement: 06/10/17 Potential to Achieve Goals: Good    Frequency Min 3X/week   Barriers to discharge        Co-evaluation               AM-PAC PT "6 Clicks" Daily Activity  Outcome Measure Difficulty turning over in bed (including adjusting bedclothes, sheets and blankets)?: A Little Difficulty moving from lying on back to sitting on the side of the bed? : A Little Difficulty sitting down on and standing up from a chair with arms (e.g.,  wheelchair, bedside commode, etc,.)?: Unable Help needed moving to and from a bed to chair (including a wheelchair)?: A Little Help needed walking in hospital room?: A Little Help needed climbing 3-5 steps with a railing? : A Lot 6 Click Score: 15    End of Session Equipment Utilized During Treatment: Gait belt Activity Tolerance: Patient limited by pain;Other (comment)(Limited by nausea) Patient left: in bed;with bed alarm set;with call bell/phone within reach Nurse Communication: Mobility status PT Visit Diagnosis: Other abnormalities of gait and mobility (R26.89);Muscle weakness (generalized) (M62.81)    Time: [id number] PT Time Calculation (min) (ACUTE ONLY): 18 min   Charges:   PT Evaluation $PT Eval Moderate Complexity: 1 Mod     PT G Codes:       Ina HomesJaclyn Davelle Anselmi, PT, DPT Acute Rehab Services  Pager: [phone]  Malachy ChamberJaclyn L Danali Marinos 05/27/2017, 9:27 AM

## 2017-05-27 NOTE — Clinical Social Work Note (Signed)
Clinical Social Work Assessment  Patient Details  Name: Hayden Harmon MRN: [id number] Date of Birth: 09/07/1940  Date of referral:  05/27/17               Reason for consult:  Facility Placement, Discharge Planning                Permission sought to share information with:    Permission granted to share information::  No  Name::        Agency::     Relationship::     Contact Information:     Housing/Transportation Living arrangements for the past 2 months:  Single Family Home Source of Information:  Patient, Medical Team Patient Interpreter Needed:  None Criminal Activity/Legal Involvement Pertinent to Current Situation/Hospitalization:  No - Comment as needed Significant Relationships:  Siblings Lives with:  Siblings Do you feel safe going back to the place where you live?  Yes Need for family participation in patient care:  Yes (Comment)  Care giving concerns:  PT recommending SNF once medically stable for discharge.   Social Worker assessment / plan:  CSW met with patient. No supports at bedside. CSW introduced role and explained that PT recommendations would be discussed. Patient replied, "No" before SNF was mentioned. When CSW asked about SNF patient again replied "no." He is willing to consider home health. RNCM notified. No further concerns. CSW will continue to follow in case plans change.   Employment status:  Retired Forensic scientist:  Medicare PT Recommendations:  [place] / Referral to community resources:  [place]  Patient/Family's Response to care:  Patient refusing SNF but willing to consider HHPT. Patient's siblings supportive and involved in patient's care. Patient appreciated social work intervention.  Patient/Family's Understanding of and Emotional Response to Diagnosis, Current Treatment, and Prognosis:  Patient has a good understanding of the reason for admission and his need for therapy after discharge.  Patient appears pleased with hospital care.  Emotional Assessment Appearance:  Appears stated age Attitude/Demeanor/Rapport:  Engaged Affect (typically observed):  Appropriate, Calm Orientation:  Oriented to Self, Oriented to Place, Oriented to  Time, Oriented to Situation Alcohol / Substance use:  Never Used Psych involvement (Current and /or in the community):  No (Comment)  Discharge Needs  Concerns to be addressed:  Care Coordination Readmission within the last 30 days:  No Current discharge risk:  Dependent with Mobility Barriers to Discharge:  Continued Medical Work up   Candie Chroman, LCSW 05/27/2017, 3:28 PM

## 2017-05-28 ENCOUNTER — Inpatient Hospital Stay (HOSPITAL_COMMUNITY): Payer: Medicare Other

## 2017-05-28 ENCOUNTER — Encounter (HOSPITAL_COMMUNITY): Payer: Self-pay

## 2017-05-28 DIAGNOSIS — R1114 Bilious vomiting: Secondary | ICD-10-CM

## 2017-05-28 DIAGNOSIS — N183 Chronic kidney disease, stage 3 (moderate): Secondary | ICD-10-CM

## 2017-05-28 DIAGNOSIS — Z978 Presence of other specified devices: Secondary | ICD-10-CM

## 2017-05-28 DIAGNOSIS — K56609 Unspecified intestinal obstruction, unspecified as to partial versus complete obstruction: Secondary | ICD-10-CM | POA: Diagnosis not present

## 2017-05-28 DIAGNOSIS — I129 Hypertensive chronic kidney disease with stage 1 through stage 4 chronic kidney disease, or unspecified chronic kidney disease: Secondary | ICD-10-CM

## 2017-05-28 DIAGNOSIS — A09 Infectious gastroenteritis and colitis, unspecified: Principal | ICD-10-CM

## 2017-05-28 LAB — COMPREHENSIVE METABOLIC PANEL
ALT: 10 U/L — ABNORMAL LOW (ref 17–63)
AST: 20 U/L (ref 15–41)
Albumin: 3.5 g/dL (ref 3.5–5.0)
Alkaline Phosphatase: 62 U/L (ref 38–126)
Anion gap: 14 (ref 5–15)
BUN: 28 mg/dL — ABNORMAL HIGH (ref 6–20)
CO2: 22 mmol/L (ref 22–32)
Calcium: 8.6 mg/dL — ABNORMAL LOW (ref 8.9–10.3)
Chloride: 101 mmol/L (ref 101–111)
Creatinine, Ser: 1.5 mg/dL — ABNORMAL HIGH (ref 0.61–1.24)
GFR calc Af Amer: 50 mL/min — ABNORMAL LOW (ref >60)
GFR calc non Af Amer: 43 mL/min — ABNORMAL LOW (ref >60)
Glucose, Bld: 120 mg/dL — ABNORMAL HIGH (ref 65–99)
Potassium: 4.3 mmol/L (ref 3.5–5.1)
Sodium: 137 mmol/L (ref 135–145)
Total Bilirubin: 1.5 mg/dL — ABNORMAL HIGH (ref 0.3–1.2)
Total Protein: 7 g/dL (ref 6.5–8.1)

## 2017-05-28 LAB — GLUCOSE, CAPILLARY: Glucose-Capillary: 132 mg/dL — ABNORMAL HIGH (ref 65–99)

## 2017-05-28 MED ORDER — PROCHLORPERAZINE EDISYLATE 5 MG/ML IJ SOLN: 10.0000 mg | Freq: Four times a day (QID) | INTRAMUSCULAR | Status: DC | PRN

## 2017-05-28 MED ORDER — METOPROLOL TARTRATE 5 MG/5ML IV SOLN
5.0000 mg | INTRAVENOUS | Status: AC | PRN
  Administered 2017-05-28 – 2017-05-29 (×3): 5 mg via INTRAVENOUS
  Filled 2017-05-28 (×3): qty 5

## 2017-05-28 MED ORDER — METOPROLOL TARTRATE 5 MG/5ML IV SOLN
INTRAVENOUS | Status: AC
  Administered 2017-05-28: 5 mg
  Filled 2017-05-28: qty 5

## 2017-05-28 MED ORDER — METOPROLOL TARTRATE 5 MG/5ML IV SOLN
5.0000 mg | INTRAVENOUS | Status: AC | PRN
  Administered 2017-05-28 (×3): 5 mg via INTRAVENOUS
  Filled 2017-05-28 (×4): qty 5

## 2017-05-28 MED ORDER — PANTOPRAZOLE SODIUM 40 MG IV SOLR
40.0000 mg | Freq: Two times a day (BID) | INTRAVENOUS | Status: DC
  Administered 2017-05-28 – 2017-06-04 (×16): 40 mg via INTRAVENOUS
  Filled 2017-05-28 (×16): qty 40

## 2017-05-28 MED ORDER — SODIUM CHLORIDE 0.9 % IV SOLN
INTRAVENOUS | Status: DC
  Administered 2017-05-28 – 2017-05-31 (×6): via INTRAVENOUS

## 2017-05-28 MED ORDER — PROCHLORPERAZINE EDISYLATE 5 MG/ML IJ SOLN
10.0000 mg | Freq: Four times a day (QID) | INTRAMUSCULAR | Status: DC | PRN
  Administered 2017-05-28: 10 mg via INTRAVENOUS
  Filled 2017-05-28 (×2): qty 2

## 2017-05-28 NOTE — Consult Note (Addendum)
Referring Provider:  Dr. Sandre Kittyaines Primary Care Physician:  Doneen PoissonKlima, Lawrence, MD Primary Gastroenterologist:  unassigned  Reason for Consultation:  Nausea, vomiting, entritis  HPI: Hayden ClientJohn N Harmon is a 77 y.o. male past medical history of hypertension, paroxysmal atrial fibrillation, history of microcytic anemia , admitted to the hospital on 05/25/2017 with nausea, vomiting and diarrhea.CT kidney stone protocol showed changes suspicious for small bowel enteritis. Patient continues to remain symptomatic and GI is consulted for further evaluation.  Patient seen and examined at bedside. Patient somewhat sleepy but able to give limited history. His complaining of generalized abdominal discomfort with right-sided abdominal pain. Discussed with the nursing staff. Only had 1 episode of vomiting today. His diarrhea is resolved now. Denies any blood in the stool or black stool.  History was limited because patient was somewhat sleepy. Past Medical History:  Diagnosis Date  . A-fib (HCC)   . CKD (chronic kidney disease)   . Essential hypertension 04/10/2017  . Gout   . History of tobacco use   . Iron deficiency anemia   . MI, old 572015   Cath/stent recommended, refused PCI    Past Surgical History:  Procedure Laterality Date  . INGUINAL HERNIA REPAIR      Prior to Admission medications   Medication Sig Start Date End Date Taking? Authorizing Provider  allopurinol (ZYLOPRIM) 100 MG tablet Take 2 tablets (200 mg total) by mouth daily. 05/08/17  Yes Doneen PoissonKlima, Lawrence, MD  aspirin EC 81 MG tablet Take 81 mg by mouth daily.   Yes [provider]  clopidogrel (PLAVIX) 75 MG tablet Take 75 mg by mouth daily.   Yes [provider]  diltiazem (DILT-XR) 240 MG 24 hr capsule Take 1 capsule (240 mg total) by mouth daily. 05/08/17  Yes Doneen PoissonKlima, Lawrence, MD  lisinopril (PRINIVIL,ZESTRIL) 2.5 MG tablet Take 1 tablet (2.5 mg total) by mouth daily. 04/28/17  Yes Burns SpainButcher, Elizabeth A, MD  ferrous sulfate  325 (65 FE) MG tablet Take 1 tablet (325 mg total) by mouth daily. Patient not taking: Reported on 05/21/2017 05/08/17 06/07/17  Lorenso Courierhundi, Vahini, MD  ondansetron (ZOFRAN ODT) 4 MG disintegrating tablet Take 1 tablet (4 mg total) by mouth every 8 (eight) hours as needed for nausea or vomiting. Patient not taking: Reported on 05/25/2017 05/21/17   Little, Ambrose Finlandachel Morgan, MD    Scheduled Meds: . clopidogrel  75 mg Oral Daily  . diltiazem  240 mg Oral Daily  . heparin  5,000 Units Subcutaneous Q8H  . pantoprazole  40 mg Oral Daily  . sucralfate  1 g Oral TID WC & HS   Continuous Infusions: . sodium chloride 125 mL/hr at 05/28/17 0312   PRN Meds:.acetaminophen **OR** acetaminophen, gi cocktail, metoprolol tartrate, ondansetron **OR** ondansetron (ZOFRAN) IV, prochlorperazine, promethazine  Allergies as of 05/25/2017  . (No Known Allergies)    History reviewed. No pertinent family history.  Social History   Socioeconomic History  . Marital status: Legally Separated    Spouse name: Not on file  . Number of children: Not on file  . Years of education: Not on file  . Highest education level: Not on file  Occupational History  . Not on file  Social Needs  . Financial resource strain: Not on file  . Food insecurity:    Worry: Not on file    Inability: Not on file  . Transportation needs:    Medical: Not on file    Non-medical: Not on file  Tobacco Use  . Smoking status:  Never Smoker  . Smokeless tobacco: Never Used  Substance and Sexual Activity  . Alcohol use: Never    Frequency: Never  . Drug use: Never  . Sexual activity: Not Currently  Lifestyle  . Physical activity:    Days per week: Not on file    Minutes per session: Not on file  . Stress: Not on file  Relationships  . Social connections:    Talks on phone: Not on file    Gets together: Not on file    Attends religious service: Not on file    Active member of club or organization: Not on file    Attends meetings of  clubs or organizations: Not on file    Relationship status: Not on file  . Intimate partner violence:    Fear of current or ex partner: Not on file    Emotionally abused: Not on file    Physically abused: Not on file    Forced sexual activity: Not on file  Other Topics Concern  . Not on file  Social History Narrative  . Not on file    Review of Systems: Limited review of systems per history of present illness.  Physical Exam: Vital signs: Vitals:   05/27/17 2001 05/28/17 0444  BP: 115/89 131/87  Pulse: (!) 101 (!) 129  Resp: (!) 28 (!) 31  Temp: 98.2 F (36.8 C) 98.7 F (37.1 C)  SpO2: 100% 98%   Last BM Date: 05/25/17 Physical Exam  Constitutional: He appears well-developed and well-nourished. No distress.  HENT:  Head: Normocephalic and atraumatic.  Mouth/Throat: No oropharyngeal exudate.  Eyes: No scleral icterus.  Conjunctivae injected.  Neck: Normal range of motion. Neck supple.  Cardiovascular:  Irregular rate and rhythm, tachycardia  Pulmonary/Chest: Effort normal and breath sounds normal. No respiratory distress.  Anterior exam only  Abdominal: Soft. Bowel sounds are normal. He exhibits distension. There is no rebound and no guarding.  Mild right upper quadrant tenderness to palpation. No peritoneal signs  Musculoskeletal: Normal range of motion. He exhibits no edema.  Neurological:  Alert but somewhat sleepy. Answers questions appropriately  Skin: Skin is warm. No erythema.  Psychiatric: He has a normal mood and affect. Judgment normal.  Vitals reviewed.   GI:  Lab Results: Recent Labs    05/25/17 1609 05/26/17 0454 05/27/17 0415  WBC 16.0* 10.5 8.3  HGB 15.4 11.6* 11.9*  HCT 50.3 38.5* 39.5  PLT 419* 301 295   BMET Recent Labs    05/26/17 1151 05/27/17 0415 05/28/17 0751  NA 130* 132* 137  K 3.8 4.3 4.3  CL 99* 98* 101  CO2 21* 24 22  GLUCOSE 92 104* 120*  BUN 64* 43* 28*  CREATININE 2.02* 1.66* 1.50*  CALCIUM 7.3* 7.8* 8.6*    LFT Recent Labs    05/28/17 0751  PROT 7.0  ALBUMIN 3.5  AST 20  ALT 10*  ALKPHOS 62  BILITOT 1.5*   PT/INR No results for input(s): LABPROT, INR in the last 72 hours.   Studies/Results: No results found.  Impression/Plan: - nausea and vomiting. CT scan showed possible small bowel enteritis. - right upper quadrant abdominal pain. Patient with normal LFTs. Normal lipase on admission. - Diarrhea. Resolved. - Acute kidney injury. Improving. Creatinine down to 1.50 now. - chronic iron deficiency anemia. Hgb 11.9. - history of  atrial fibrillation. Currently on Plavix  Recommendations -------------------------- - Continue supportive care for now. - Ultrasound right upper quadrant for evaluation of nausea, vomiting and right  upper quadrant abdominal pain - change Protonix to IV twice a day. - GI will follow   LOS: 3 days   Kathi Der  MD, FACP 05/28/2017, 10:58 AM  Contact #  [phone]

## 2017-05-28 NOTE — Progress Notes (Addendum)
  Date: 05/28/2017  Patient name: Hayden Harmon  Medical record number: [id number]  Date of birth: Aug 26, 1940   I have seen and evaluated this patient and I have discussed the plan of care with the house staff. Please see their note for complete details. I concur with their findings with the following additions/corrections:   Developed acute bilious vomiting this morning. His abdomen is distended, but soft and remarkably nontender. He has active bowel sounds that do not seem particularly hyperactive.We consulted GI and obtain a plain film of the abdomen. GI recommended right upper quadrant ultrasound and increasing his PPI to twice daily.  KUB showed obstructive gas pattern concerning for SBO. Oddly, this appears to be new since admission, despite the fact that he presented with vomiting. It is possible he has a partial obstruction due to enteritis. We have consulted surgery, and appreciate the recommendations. We will decompress his stomach with NG tube and reevaluate his abdomen with CT scan.  AKI on CKD stage III has resolved. Continues in Afib with RVR overnight, rate control improved this morning.   Jessy OtoAlexander Raines, M.D., Ph.D. 05/28/2017, 6:13 PM

## 2017-05-28 NOTE — Plan of Care (Signed)
  Problem: Education: Goal: Knowledge of General Education information will improve Outcome: Progressing   Problem: Skin Integrity: Goal: Risk for impaired skin integrity will decrease Outcome: Progressing   Problem: Health Behavior/Discharge Planning: Goal: Ability to manage health-related needs will improve Outcome: Not Progressing   Problem: Clinical Measurements: Goal: Ability to maintain clinical measurements within normal limits will improve Outcome: Not Progressing   Problem: Nutrition: Goal: Adequate nutrition will be maintained Outcome: Not Progressing   Problem: Elimination: Goal: Will not experience complications related to urinary retention Outcome: Not Progressing

## 2017-05-28 NOTE — Consult Note (Signed)
Reason for Consult: Abdominal pain, nausea and vomiting. Referring Physician: Molt MD  Hayden Harmon is an 77 y.o. male.  HPI: Asked to see the patient at the request of Dr. Danford Bad with a 1 day history of nausea, vomiting.  The patient was admitted on 05/25/2017 with nonspecified abdominal pain.  A CT scan was obtained which was noncontrasted which showed signs of possible enteritis.  The patient gave a history of diarrhea.  He is also in atrial fibrillation and his rate was poorly controlled.  Today he began to have nausea and vomiting.  He has had flatus but no bowel movement.  He is not tolerating his diet times 1 day.  He will open his eyes but he will not answer my questions when I talked him today.  He apparently has received pain medicine recently and is sleepy.  Past Medical History:  Diagnosis Date  . A-fib ([place])   . CKD (chronic kidney disease)   . Essential hypertension 04/10/2017  . Gout   . History of tobacco use   . Iron deficiency anemia   . MI, old 68   Cath/stent recommended, refused PCI    Past Surgical History:  Procedure Laterality Date  . INGUINAL HERNIA REPAIR      History reviewed. No pertinent family history.  Social History:  reports that he has never smoked. He has never used smokeless tobacco. He reports that he does not drink alcohol or use drugs.  Allergies: No Known Allergies  Medications: I have reviewed the patient's current medications.  Results for orders placed or performed during the hospital encounter of 05/25/17 (from the past 48 hour(s))  CBC     Status: Abnormal   Collection Time: 05/27/17  4:15 AM  Result Value Ref Range   WBC 8.3 4.0 - 10.5 K/uL   RBC 6.31 (H) 4.22 - 5.81 MIL/uL   Hemoglobin 11.9 (L) 13.0 - 17.0 g/dL   HCT 39.5 39.0 - 52.0 %   MCV 62.6 (L) 78.0 - 100.0 fL   MCH 18.9 (L) 26.0 - 34.0 pg   MCHC 30.1 30.0 - 36.0 g/dL   RDW 21.8 (H) 11.5 - 15.5 %   Platelets 295 150 - 400 K/uL    Comment: Performed at [facility], [address], [place], [place] [address]  Basic metabolic panel     Status: Abnormal   Collection Time: 05/27/17  4:15 AM  Result Value Ref Range   Sodium 132 (L) 135 - 145 mmol/L   Potassium 4.3 3.5 - 5.1 mmol/L   Chloride 98 (L) 101 - 111 mmol/L   CO2 24 22 - 32 mmol/L   Glucose, Bld 104 (H) 65 - 99 mg/dL   BUN 43 (H) 6 - 20 mg/dL   Creatinine, Ser 1.66 (H) 0.61 - 1.24 mg/dL   Calcium 7.8 (L) 8.9 - 10.3 mg/dL   GFR calc non Af Amer 38 (L) >60 mL/min   GFR calc Af Amer 44 (L) >60 mL/min    Comment: (NOTE) The eGFR has been calculated using the CKD EPI equation. This calculation has not been validated in all clinical situations. eGFR's persistently <60 mL/min signify possible Chronic Kidney Disease.    Anion gap 10 5 - 15    Comment: Performed at [facility] [address] [place], [place] [address]  Glucose, capillary     Status: Abnormal   Collection Time: 05/28/17  6:23 AM  Result Value Ref Range   Glucose-Capillary  132 (H) 65 - 99 mg/dL  Comprehensive metabolic panel     Status: Abnormal   Collection Time: 05/28/17  7:51 AM  Result Value Ref Range   Sodium 137 135 - 145 mmol/L   Potassium 4.3 3.5 - 5.1 mmol/L   Chloride 101 101 - 111 mmol/L   CO2 22 22 - 32 mmol/L   Glucose, Bld 120 (H) 65 - 99 mg/dL   BUN 28 (H) 6 - 20 mg/dL   Creatinine, Ser 1.50 (H) 0.61 - 1.24 mg/dL   Calcium 8.6 (L) 8.9 - 10.3 mg/dL   Total Protein 7.0 6.5 - 8.1 g/dL   Albumin 3.5 3.5 - 5.0 g/dL   AST 20 15 - 41 U/L   ALT 10 (L) 17 - 63 U/L   Alkaline Phosphatase 62 38 - 126 U/L   Total Bilirubin 1.5 (H) 0.3 - 1.2 mg/dL   GFR calc non Af Amer 43 (L) >60 mL/min   GFR calc Af Amer 50 (L) >60 mL/min    Comment: (NOTE) The eGFR has been calculated using the CKD EPI equation. This calculation has not been validated in all clinical situations. eGFR's persistently <60 mL/min signify possible Chronic Kidney Disease.    Anion gap 14 5 - 15    Comment: Performed at [facility] [address] [place], [place] [address]    Dg Abd Portable 1v  Result Date: 05/28/2017 CLINICAL DATA:  Recent onset of vomiting. EXAM: PORTABLE ABDOMEN - 1 VIEW COMPARISON:  CT scan May 25, 2017 FINDINGS: Dilated small bowel loops are identified with a paucity of colonic gas consistent with a small bowel obstruction. No free air, portal venous gas, or pneumatosis identified. IMPRESSION: Small bowel obstruction. These results will be called to the ordering clinician or representative by the Radiologist Assistant, and communication documented in the PACS or zVision Dashboard. Electronically Signed   By: Dorise Bullion III M.D   On: 05/28/2017 12:56    Review of Systems  Unable to perform ROS: Medical condition   Blood pressure 131/87, pulse (!) 129, temperature 98.7 F (37.1 C), temperature source Oral, resp. rate (!) 31, height '5\' 11"'  (1.803 m), weight 87.4 kg (192 lb 10.9 oz), SpO2 98 %. Physical Exam  Constitutional: He appears well-developed and well-nourished. He appears lethargic. No distress.  HENT:  Head: Normocephalic.  Eyes: Pupils are equal, round, and reactive to light.  Neck: Normal range of motion. Neck supple.  Cardiovascular:  Atrial fibrillation with a rate of 130  Respiratory: Effort normal and breath sounds normal.  GI: Soft. He exhibits distension. He exhibits no mass. There is no tenderness. There is no rebound and no guarding. No hernia. Hernia confirmed negative in the right inguinal area and confirmed negative in the left inguinal area.  Genitourinary: Penis normal.  Musculoskeletal: Normal range of motion.  Neurological: He appears lethargic. GCS eye subscore is 4. GCS verbal subscore is 1. GCS motor subscore is 6.  Psychiatric: Cognition and memory are impaired. He is noncommunicative.    Assessment/Plan: Nausea, vomiting and abdominal distention with dilation of small bowel loops on plain film.  Reviewed CT scan for admission which shows no evidence of  obstruction.  He may have possible enteritis.  He is nontender on exam without rebound or guarding.  Will obtain CT scan for further evaluation.  If this shows small bowel obstruction, will begin small bowel protocol.  Recommend NG tube for decompression.  CCS to follow  [employer] 05/28/2017,  2:56 PM

## 2017-05-28 NOTE — Progress Notes (Signed)
Subjective:  Patient seen and examined at bedside. He had fresh bilious emesis on face, gown and sheets. Has been unable to tolerate PO intake today. No BM since admit, states he passed gas earlier during admission.   Objective:  Vital signs in last 24 hours: Vitals:   05/27/17 1000 05/27/17 1207 05/27/17 2001 05/28/17 0444  BP: 101/74 101/74 115/89 131/87  Pulse:  66 (!) 101 (!) 129  Resp:  17 (!) 28 (!) 31  Temp:  98.4 F (36.9 C) 98.2 F (36.8 C) 98.7 F (37.1 C)  TempSrc:  Oral Oral Oral  SpO2:  95% 100% 98%  Weight:    192 lb 10.9 oz (87.4 kg)  Height:       General: AA male sprawled out on bed, soiled with bilious emesis. Fatigued. HEENT: Blunt/AT, no scleral icterus. Mucous membranes dry.  Cardiac: Tachycardic with irregular rhythm. No R/M/G appreciated Pulm: normal effort, saturating well on RA Abd: Distended, non-tender. No bowel sounds appreciated. Ext: extremities well perfused, no peripheral edema  Assessment/Plan:  Principal Problem:   Small bowel obstruction (HCC) Active Problems:   Acute renal failure (HCC)   Atrial fibrillation with RVR (HCC)   Enteritis   Hyponatremia   Prostate enlargement   Aortic atherosclerosis (HCC)  SBO Patient with distended abdomen and several episodes of large volume bilious emesis over the past 12 hours. STAT KUB with SBO. He has not had BM since admit but does admit to some flatus, although did not specify when. Initial scan on admission was without evidence for obstruction. He surprisingly is without any abdominal pain but did mention his stomach felt "weird."  Have made NPO and asked general surgery for their expertise. We appreciate their recommendations.  -General surgery consulted -NPO -RN to page if patient has continued emesis, would require NGT at that point  Vomiting, Small Bowel Enteritis Patient with mixed history in regards to if he had diarrhea at home prior to admission. If so, it did not seem to be significant.  Small bowel enteritis appreciated on admission CT scan, most often associated with an infectious enteritis. GI pathogen panel unable to be collected given lack of BMs. We have asked GI to evaluate who recommended IV protonix BID and continued supportive care. We appreciate their consultation.  Atrial Fibrillation with RVR Likely secondary to hypovolemia as well as not being able to tolerate PO medications. He has been tachycardic as of last night and it seems he did not get his PO dilt yesterday. He was able to take his PO dilt at 3am however has required several additional doses of IV Metop to keep rates better controlled. Will continue PO dilt with prn Metop, but may need to resume dilt infusion at some point.  -Cardiac monitoring -Cardizem 240 mg daily -SQ heparin  AKI Renal function continues to improve and is most consistent with AKI in setting of dehydration/poor PO intake. Received several liters of IVF and currently receiving maintenance fluids given SBO/NPO status.  -AM BMETs -Follow UOP  Hyponatremia, Resolved Na 128>>137, likely secondary to hypovolemia. Resolved. Will continue to monitor closely.  Chronic Microcytic Anemia Hgb at baseline of 11.9  MCV 60s. Iron studies consistent with iron deficiency anemia. No evidence of acute blood loss. Might benefit from parental iron at some point.  -CBC in AM  HTN BP soft on admission and we are continuing to hold  lisinopril in setting of AKI and low Bps.  DVT ppx: [place] heparin Diet: NPO  Code Status:  full code, confirmed on admission  Dispo: Anticipated discharge in approximately 2-3 day(s).   Jamail Cullers, DO 05/28/2017, 1:54 PM Pager: [phone]

## 2017-05-28 NOTE — Progress Notes (Signed)
Patient's HR went into afib RVR 120-150s. Patient denies SOB and CP. Patient c/o of stomach hurting/nauseous. Notified on call MD. Patient given 12.5 mg phenergan. Unable to give PO cardizem at this time per MD due to vomiting 800 ml of green bile. MD made aware. Maintenance fluids restarted NS 125 ml/hr. Will continue to monitor

## 2017-05-29 ENCOUNTER — Inpatient Hospital Stay (HOSPITAL_COMMUNITY): Payer: Medicare Other

## 2017-05-29 LAB — CBC WITH DIFFERENTIAL/PLATELET
Basophils Absolute: 0 10*3/uL (ref 0.0–0.1)
Basophils Relative: 0 %
Eosinophils Absolute: 0 10*3/uL (ref 0.0–0.7)
Eosinophils Relative: 0 %
HCT: 39.2 % (ref 39.0–52.0)
Hemoglobin: 11.6 g/dL — ABNORMAL LOW (ref 13.0–17.0)
Lymphocytes Relative: 11 %
Lymphs Abs: 1.8 10*3/uL (ref 0.7–4.0)
MCH: 18.4 pg — ABNORMAL LOW (ref 26.0–34.0)
MCHC: 29.6 g/dL — ABNORMAL LOW (ref 30.0–36.0)
MCV: 62 fL — ABNORMAL LOW (ref 78.0–100.0)
Monocytes Absolute: 1 10*3/uL (ref 0.1–1.0)
Monocytes Relative: 6 %
Neutro Abs: 13.3 10*3/uL — ABNORMAL HIGH (ref 1.7–7.7)
Neutrophils Relative %: 83 %
Platelets: 337 10*3/uL (ref 150–400)
RBC: 6.32 MIL/uL — ABNORMAL HIGH (ref 4.22–5.81)
RDW: 22 % — ABNORMAL HIGH (ref 11.5–15.5)
WBC: 16.1 10*3/uL — ABNORMAL HIGH (ref 4.0–10.5)

## 2017-05-29 LAB — COMPREHENSIVE METABOLIC PANEL
ALT: 10 U/L — ABNORMAL LOW (ref 17–63)
AST: 16 U/L (ref 15–41)
Albumin: 3 g/dL — ABNORMAL LOW (ref 3.5–5.0)
Alkaline Phosphatase: 57 U/L (ref 38–126)
Anion gap: 9 (ref 5–15)
BUN: 28 mg/dL — ABNORMAL HIGH (ref 6–20)
CO2: 23 mmol/L (ref 22–32)
Calcium: 8.1 mg/dL — ABNORMAL LOW (ref 8.9–10.3)
Chloride: 106 mmol/L (ref 101–111)
Creatinine, Ser: 1.65 mg/dL — ABNORMAL HIGH (ref 0.61–1.24)
GFR calc Af Amer: 44 mL/min — ABNORMAL LOW (ref >60)
GFR calc non Af Amer: 38 mL/min — ABNORMAL LOW (ref >60)
Glucose, Bld: 126 mg/dL — ABNORMAL HIGH (ref 65–99)
Potassium: 4.3 mmol/L (ref 3.5–5.1)
Sodium: 138 mmol/L (ref 135–145)
Total Bilirubin: 1.2 mg/dL (ref 0.3–1.2)
Total Protein: 6.1 g/dL — ABNORMAL LOW (ref 6.5–8.1)

## 2017-05-29 LAB — GLUCOSE, CAPILLARY
Glucose-Capillary: 103 mg/dL — ABNORMAL HIGH (ref 65–99)
Glucose-Capillary: 117 mg/dL — ABNORMAL HIGH (ref 65–99)

## 2017-05-29 LAB — KAPPA/LAMBDA LIGHT CHAINS
Kappa free light chain: 62.7 mg/L — ABNORMAL HIGH (ref 3.3–19.4)
Kappa, lambda light chain ratio: 2.42 — ABNORMAL HIGH (ref 0.26–1.65)
Lambda free light chains: 25.9 mg/L (ref 5.7–26.3)

## 2017-05-29 LAB — IMMUNOFIXATION ELECTROPHORESIS
IgA: 210 mg/dL (ref 61–437)
IgG (Immunoglobin G), Serum: 1295 mg/dL (ref 700–1600)
IgM (Immunoglobulin M), Srm: 49 mg/dL (ref 15–143)
Total Protein ELP: 6.9 g/dL (ref 6.0–8.5)

## 2017-05-29 MED ORDER — DIATRIZOATE MEGLUMINE & SODIUM 66-10 % PO SOLN
90.0000 mL | Freq: Once | ORAL | Status: AC
  Administered 2017-05-29: 90 mL via NASOGASTRIC
  Filled 2017-05-29: qty 90

## 2017-05-29 MED ORDER — DILTIAZEM HCL-DEXTROSE 100-5 MG/100ML-% IV SOLN (PREMIX)
5.0000 mg/h | INTRAVENOUS | Status: DC
Start: 2017-05-29 — End: 2017-06-04
  Administered 2017-05-29: 5 mg/h via INTRAVENOUS
  Administered 2017-05-30 (×3): 10 mg/h via INTRAVENOUS
  Administered 2017-05-31: 12.5 mg/h via INTRAVENOUS
  Administered 2017-05-31 – 2017-06-04 (×15): 15 mg/h via INTRAVENOUS
  Filled 2017-05-29 (×23): qty 100

## 2017-05-29 NOTE — Progress Notes (Signed)
[facility]  Hayden Harmon 77 y.o. [birth year]  CC:  Nausea, vomiting   Subjective: yesterday's events noted. Repeat CT showed high-grade small bowel obstruction.NG tube in place.denies abdominal pain. No bowel movement but passing gas.     Objective: Vital signs in last 24 hours: Vitals:   05/29/17 0700 05/29/17 0900  BP:    Pulse: (!) 115 (!) 101  Resp: (!) 23 (!) 21  Temp:  98.3 F (36.8 C)  SpO2: 99% 98%    Physical Exam:  Gen. Alert. NG tube in place. No acute distress Abdomen. Mild distended, nontender, no peritoneal signs. Bowel sounds present.  Lab Results: Recent Labs    05/28/17 0751 05/29/17 0329  NA 137 138  K 4.3 4.3  CL 101 106  CO2 22 23  GLUCOSE 120* 126*  BUN 28* 28*  CREATININE 1.50* 1.65*  CALCIUM 8.6* 8.1*   Recent Labs    05/28/17 0751 05/29/17 0329  AST 20 16  ALT 10* 10*  ALKPHOS 62 57  BILITOT 1.5* 1.2  PROT 7.0 6.1*  ALBUMIN 3.5 3.0*   Recent Labs    05/27/17 0415 05/29/17 0329  WBC 8.3 16.1*  NEUTROABS  --  13.3*  HGB 11.9* 11.6*  HCT 39.5 39.2  MCV 62.6* 62.0*  PLT 295 337   No results for input(s): LABPROT, INR in the last 72 hours.    Assessment/Plan: - nausea and vomiting. CT scan yesterday showed a high-grade small bowel obstruction. - chronic iron deficiency anemia. - history of  atrial fibrillation. Currently on Plavix  Recommendations --------------------------- - Continue supportive care. Further management per surgery. GI will sign off. Call us back if needed.   Kathi DerParag Ector Laurel MD, FACP 05/29/2017, 10:14 AM  Contact #  [phone]

## 2017-05-29 NOTE — Progress Notes (Addendum)
Subjective: Alert.  Denies pain.  States he is passing flatus.  No stools.diarrhea has subsided. Small caliber [place] sump tube in place draining bilious fluid  CT scan yesterday shows SBO with transition point in central abdomen at level of umbilicus.  No inflammatory changes, ischemic changes,free fluid or perforation.  Potassium 4.3.  Creatinine 1.65.  WBC 16,100.  Hemoglobin 11.6  History negative for intra-abdominal surgical events in past  Abdominal x-ray this morning still looks like SBO but there is gas in the splenic flexure and descending colon.  NG tube properly positioned in stomach.   Objective: Vital signs in last 24 hours: Temp:  [98.1 F (36.7 C)-99 F (37.2 C)] 98.1 F (36.7 C) (03/25 0550) Pulse Rate:  [58-79] 58 (03/25 0550) Resp:  [19-32] 19 (03/25 0550) BP: (112-135)/(67-69) 112/69 (03/25 0550) SpO2:  [97 %-99 %] 99 % (03/25 0550) Last BM Date: 05/25/17  Intake/Output from previous day: 03/24 0701 - 03/25 0700 In: 1810 [P.O.:110; I.V.:1700] Out: 1500 [Urine:900; Emesis/NG output:400; Stool:200] Intake/Output this shift: Total I/O In: 1700 [I.V.:1700] Out: 900 [Urine:500; Emesis/NG output:400]   PE General appearance: Alert and cooperative.  No distress.  Skin warm and dry Resp: clear to auscultation bilaterally GI: Somewhat distended.  Soft and nontender.  Hypoactive bowel sounds.  No mass.  No hernia detected. Neurologic: Alert and oriented X 3, normal strength and tone. Normal symmetric reflexes. Normal coordination and gait  Lab Results:  Recent Labs    05/27/17 0415 05/29/17 0329  WBC 8.3 16.1*  HGB 11.9* 11.6*  HCT 39.5 39.2  PLT 295 337   BMET Recent Labs    05/28/17 0751 05/29/17 0329  NA 137 138  K 4.3 4.3  CL 101 106  CO2 22 23  GLUCOSE 120* 126*  BUN 28* 28*  CREATININE 1.50* 1.65*  CALCIUM 8.6* 8.1*   PT/INR No results for input(s): LABPROT, INR in the last 72 hours. ABG No results for input(s): PHART, HCO3 in the  last 72 hours.  Invalid input(s): PCO2, PO2  Studies/Results: Ct Abdomen Pelvis Wo Contrast  Result Date: 05/28/2017 CLINICAL DATA:  77 year old male with nausea and vomiting. EXAM: CT ABDOMEN AND PELVIS WITHOUT CONTRAST TECHNIQUE: Multidetector CT imaging of the abdomen and pelvis was performed following the standard protocol without IV contrast. COMPARISON:  CT Abdomen and Pelvis 05/25/2017. FINDINGS: Lower chest: Respiratory motion artifact at the lung bases today. No pericardial or pleural effusion. Mild dependent atelectasis. Hepatobiliary: Negative noncontrast liver. The gallbladder is diminutive or absent. Pancreas: Negative. Spleen: Negative. Adrenals/Urinary Tract: Normal adrenal glands. Stable bilateral kidneys, multiple simple fluid density renal cysts. Decompressed ureters. Urinary bladder today is decompressed by Foley catheter. There is mild residual bladder wall thickening. Stomach/Bowel: Retained stool in the rectum similar to the prior study. Intermittent decompressed sigmoid colon. Redundant proximal sigmoid colon, descending colon, and splenic flexure. Decompressed transverse colon. Diverticulosis at the hepatic flexure and in the right colon but no active inflammation identified. Negative cecum and appendix. Decompressed terminal ileum and small bowel loops in the pelvis and right lower quadrant. Severe air and fluid distension of the stomach. Similar distension of the distal esophagus. Moderately distended duodenum and small bowel loops throughout the abdomen with an abrupt in the central abdomen near the level of the umbilicus. See series 3 images 65 through 72. No abdominal free air or free fluid. Vascular/Lymphatic: Aortoiliac calcified atherosclerosis. Vascular patency is not evaluated in the absence of IV contrast. Left iliac artery aneurysm up to 34 millimeters diameter redemonstrated.  No lymphadenopathy. Reproductive: Foley catheter in place. Prostate hypertrophy redemonstrated and  stable. Sequelae of right inguinal hernia repair with small surgical clips. Other: No pelvic free fluid. Multiple probable small ventral abdominal wall subcutaneous injection sites, such as on series 3, image 63. Musculoskeletal: Heterogeneous bone mineralization in the lower thoracic spine, although many of the lucent thoracic vertebral areas appear to be endplate Schmorl's nodes. Advanced lower lumbar facet degeneration. Mildly heterogeneous sclerotic bone mineralization in the sacrum and pelvis. Degenerative appearing acetabular subchondral cyst on the right. No destructive osseous lesion identified. IMPRESSION: 1. High-grade Acute Small Bowel Obstruction with an abrupt transition point in the central abdomen at the level of the umbilicus. See series 3 images 65 through 72. Associated severe gas and fluid distention of the stomach. 2. No abdominal free air or free fluid. 3. Urinary bladder now decompressed by Foley catheter. 4. Otherwise stable noncontrast CT appearance of the abdomen and Pelvis since 05/25/2017, including aortic atherosclerosis and 3.4 cm left iliac artery aneurysm. Electronically Signed   By: Odessa Fleming M.D.   On: 05/28/2017 22:50   Dg Abd Portable 1v  Result Date: 05/28/2017 CLINICAL DATA:  Nasogastric tube placement. EXAM: PORTABLE ABDOMEN - 1 VIEW COMPARISON:  CT 05/28/2017. FINDINGS: 1715 hours. Examination concentrates on the upper abdomen. There is diffuse gaseous distention of the stomach, small bowel and colon. Enteric tube tip overlaps the proximal stomach. Side hole is near the GE junction IMPRESSION: Enteric tube extends to the level of the proximal stomach. Electronically Signed   By: Carey Bullocks M.D.   On: 05/28/2017 17:50   Dg Abd Portable 1v  Result Date: 05/28/2017 CLINICAL DATA:  Recent onset of vomiting. EXAM: PORTABLE ABDOMEN - 1 VIEW COMPARISON:  CT scan May 25, 2017 FINDINGS: Dilated small bowel loops are identified with a paucity of colonic gas consistent with a  small bowel obstruction. No free air, portal venous gas, or pneumatosis identified. IMPRESSION: Small bowel obstruction. These results will be called to the ordering clinician or representative by the Radiologist Assistant, and communication documented in the PACS or zVision Dashboard. Electronically Signed   By: Gerome Sam III M.D   On: 05/28/2017 12:56    Anti-infectives: Anti-infectives (From admission, onward)   None      Assessment/Plan:  SBO versus enteritis.  CT suggest transition zone therefore mechanical problem. Continue NG Begin small bowel radiologic protocol  It is not clear whether he will require surgical intervention or not.  No acute Need for surgical intervention, however leukocytosis is of some concern. Hold Plavix.  Last Plavix dose documented yesterday. Continue [place] heparin. Continue twice a day Protonix.  A-fib with RVR Remote MI CKD-creatinine improved. HTN   LOS: 4 days    Ernestene Mention 05/29/2017

## 2017-05-29 NOTE — Progress Notes (Signed)
Surgery:  Due to SBO and potential need for surgery this admission, I have discontinued his plavix. Continue [place] heparin  Will follow up this morning   Terrance Lanahan M. Derrell LollingIngram, M.D., [id number] General and Minimally invasive Surgery Breast and Colorectal Surgery Office:   [phone] Pager:   [phone]

## 2017-05-29 NOTE — Progress Notes (Signed)
   Subjective:  Patient seen and examined at bedside. Denies abdominal pain or vomiting since NG tube has been placed. States he is passing gas, no BM. Large volume bilious output from NG tube noted at bedside.   Objective:  Vital signs in last 24 hours: Vitals:   05/29/17 0500 05/29/17 0550 05/29/17 0700 05/29/17 0900  BP:  112/69    Pulse:  (!) 58 (!) 115 (!) 101  Resp:  19 (!) 23 (!) 21  Temp:  98.1 F (36.7 C)  98.3 F (36.8 C)  TempSrc:  Oral  Oral  SpO2:  99% 99% 98%  Weight: 196 lb 13.9 oz (89.3 kg)     Height:       General: Laying in bed comfortably, NAD HEENT: [place]/AT, NGT in  place Cardiac: irregular rhythm tachycardic, No R/M/G appreciated Pulm: normal effort, CTAB Abd: soft, non tender, mildly distended, BS present  Ext: extremities well perfused, no peripheral edema Neuro: alert and oriented X3, cranial nerves II-XII grossly intact   Assessment/Plan:  Principal Problem:   Small bowel obstruction (HCC) Active Problems:   Acute renal failure (HCC)   Atrial fibrillation with RVR (HCC)   Enteritis   Hyponatremia   Prostate enlargement   Aortic atherosclerosis (HCC)  SBO CT scan with high-grade acute Small Bowel Obstruction with an abrupt transition point in the central abdomen at the level of the umbilicus. NGT placed, >500 cc of bilious output. Abdominal Xray showing persistent SBO pattern.  -Gen Surgery on, no acute need for surgical intervention at this time; appreciate their consultation  -Holding Plavix -Continue [place] heparin. -Continue IV Protonix BID -NPO -IVF: NS 150 cc/hr  Vomiting, Small Bowel Enteritis Patient with mixed history in regards to if he had diarrhea at home prior to admission. If so, it did not seem to be significant. Small bowel enteritis appreciated on admission CT scan, most often associated with an infectious enteritis. GI pathogen panel unable to be collected given lack of BMs. -GI signed off -Continue IV PPI BID  Atrial  Fibrillation with RVR Likely secondary to hypovolemia as well as not being able to tolerate PO medications. Poorly rate controlled over night with PRN IV metoprolol.  -Cardiac monitoring -Diltiazem gtt for now  -Holding Cardizem 240 mg daily as NPO -SQ heparin  AKI Renal function continues to improve, Creatinine 1.65 today.  AKI in setting of dehydration/poor PO intake. Received several liters of IVF and currently receiving maintenance fluids given SBO/NPO status.  -AM BMETs -Follow UOP - adequate   Hyponatremia, Resolved Na 128>>137, likely secondary to hypovolemia. Resolved.  -Will continue to monitor closely.  Chronic Microcytic Anemia Hgb at baseline of 11.9  MCV 60s. Iron studies consistent with iron deficiency anemia. No evidence of acute blood loss. Might benefit from parental iron at some point.  -CBC in AM  HTN BP soft on admission and we are continuing to hold lisinopril in setting of AKI and low Bps.  DVT ppx: [place] heparin Diet: NPO  Code Status: full code, confirmed on admission  Dispo: Anticipated discharge in approximately 2-3 day(s).   Toney RakesLacroce, Samantha J, MD 05/29/2017, 11:51 AM Pager: [phone]

## 2017-05-29 NOTE — Plan of Care (Signed)
  Problem: Education: Goal: Knowledge of General Education information will improve Outcome: Progressing   Problem: Clinical Measurements: Goal: Respiratory complications will improve Outcome: Progressing   Problem: Nutrition: Goal: Adequate nutrition will be maintained Outcome: Not Progressing   Problem: Coping: Goal: Level of anxiety will decrease Outcome: Completed/Met   Problem: Skin Integrity: Goal: Risk for impaired skin integrity will decrease Outcome: Completed/Met

## 2017-05-29 NOTE — Progress Notes (Signed)
  Date: 05/29/2017  Patient name: Alfredia ClientJohn N Myricks  Medical record number: [id number]  Date of birth: 09/22/1940   I have seen and evaluated this patient and I have discussed the plan of care with the house staff. Please see their note for complete details. I concur with their findings with the following additions/corrections:   Ongoing SBO, most likely due to infectious enteritis. No h/o abdominal surgery to cause adhesions, no h/o IBD. CT has clear transition point. NG tube with large amount of brownish-green output this morning. Abdomen not as distended today, still soft and non-tender.  Appreciate surgery recommendations, continue with NG for decompression and small bowel radiologic protocol.   Rate control has been difficult with A fib and NPO, controlling with diltiazem gtt.  Jessy OtoAlexander Raines, M.D., Ph.D. 05/29/2017, 4:51 PM

## 2017-05-30 ENCOUNTER — Inpatient Hospital Stay (HOSPITAL_COMMUNITY): Payer: Medicare Other

## 2017-05-30 LAB — BASIC METABOLIC PANEL
Anion gap: 11 (ref 5–15)
BUN: 23 mg/dL — ABNORMAL HIGH (ref 6–20)
CO2: 22 mmol/L (ref 22–32)
Calcium: 8.2 mg/dL — ABNORMAL LOW (ref 8.9–10.3)
Chloride: 111 mmol/L (ref 101–111)
Creatinine, Ser: 1.38 mg/dL — ABNORMAL HIGH (ref 0.61–1.24)
GFR calc Af Amer: 55 mL/min — ABNORMAL LOW (ref >60)
GFR calc non Af Amer: 47 mL/min — ABNORMAL LOW (ref >60)
Glucose, Bld: 103 mg/dL — ABNORMAL HIGH (ref 65–99)
Potassium: 3.9 mmol/L (ref 3.5–5.1)
Sodium: 144 mmol/L (ref 135–145)

## 2017-05-30 LAB — CULTURE, BLOOD (ROUTINE X 2)
Culture: NO GROWTH
Culture: NO GROWTH
Special Requests: ADEQUATE
Special Requests: ADEQUATE

## 2017-05-30 LAB — CBC
HCT: 38.5 % — ABNORMAL LOW (ref 39.0–52.0)
Hemoglobin: 11.4 g/dL — ABNORMAL LOW (ref 13.0–17.0)
MCH: 18.4 pg — ABNORMAL LOW (ref 26.0–34.0)
MCHC: 29.6 g/dL — ABNORMAL LOW (ref 30.0–36.0)
MCV: 62.3 fL — ABNORMAL LOW (ref 78.0–100.0)
PLATELETS: 333 10*3/uL (ref 150–400)
RBC: 6.18 MIL/uL — ABNORMAL HIGH (ref 4.22–5.81)
RDW: 22.3 % — AB (ref 11.5–15.5)
WBC: 12.6 10*3/uL — ABNORMAL HIGH (ref 4.0–10.5)

## 2017-05-30 LAB — URINALYSIS, ROUTINE W REFLEX MICROSCOPIC
Bilirubin Urine: NEGATIVE
Glucose, UA: NEGATIVE mg/dL
Ketones, ur: 20 mg/dL — AB
Nitrite: POSITIVE — AB
Protein, ur: 100 mg/dL — AB
Specific Gravity, Urine: 1.013 (ref 1.005–1.030)
pH: 5 (ref 5.0–8.0)

## 2017-05-30 LAB — GLUCOSE, CAPILLARY: Glucose-Capillary: 96 mg/dL (ref 65–99)

## 2017-05-30 NOTE — Progress Notes (Signed)
   Subjective:  Patient seen and examined at bedside. Denies abdominal pain or vomiting since NG tube has been placed. States he is passing gas, no BM. Continues to have large volume output from NG tube noted at bedside. No other acute complaints.   Objective:  Vital signs in last 24 hours: Vitals:   05/30/17 0018 05/30/17 0501 05/30/17 0700 05/30/17 0900  BP: 117/82 129/79 122/75   Pulse: 94 97 (!) 108 83  Resp: 17 17 20 20   Temp: 98.4 F (36.9 C) (!) 97.5 F (36.4 C) 98.3 F (36.8 C)   TempSrc: Oral Oral Oral   SpO2: 99% 100% 91% 100%  Weight:  190 lb 14.7 oz (86.6 kg)    Height:       General: Laying in bed comfortably, NAD HEENT: [place]/AT, NGT in  place Cardiac: irregular rhythm, rate regular, No R/M/G appreciated Pulm: normal effort, CTAB Abd: soft, non tender, mildly distended, BS decreased  Ext: extremities well perfused, no peripheral edema Neuro: alert and oriented X3, cranial nerves II-XII grossly intact   Assessment/Plan:  Principal Problem:   Small bowel obstruction (HCC) Active Problems:   Acute renal failure (HCC)   Atrial fibrillation with RVR (HCC)   Enteritis   Hyponatremia   Prostate enlargement   Aortic atherosclerosis (HCC)  SBO Vomiting, Small Bowel Enteritis CT scan with high-grade acute Small Bowel Obstruction with an abrupt transition point in the central abdomen at the level of the umbilicus. NGT in place, high volume output since placed. Abdominal Xray showing persistent SBO pattern, not improved from prior.  -Gen Surgery on, may consider surgical intervention tomorrow of Thursday -Continue to hold Plavix -Continue [place] heparin -Continue IV Protonix BID -NPO -IVF: NS 125 cc/hr   Atrial Fibrillation with RVR Likely secondary to hypovolemia, acute illness, and not being able to tolerate PO medications. Currently rate controlled on diltiazem infusion.  -Cardiac monitoring -Continue diltiazem infusion -Holding Cardizem 240 mg daily as  NPO -SQ heparin  AKI Resolved, renal function stable Creatinine 1.38 today.  AKI in setting of dehydration/poor PO intake. Received several liters of IVF and currently receiving maintenance fluids given SBO/NPO status.  -AM BMETs -Follow UOP - adequate   Chronic Microcytic Anemia Hgb at baseline of 11.9  MCV 60s. Iron studies consistent with iron deficiency anemia. No evidence of acute blood loss. Might benefit from parental iron at some point.  -CBC in AM  HTN  Normotensive. -Continue to hold lisinopril   DVT ppx:  heparin Diet: NPO  Code Status: full code, confirmed on admission  Dispo: Anticipated discharge in approximately 3-4 day(s).   Toney RakesLacroce, Samantha J, MD 05/30/2017, 11:42 AM Pager: [phone]

## 2017-05-30 NOTE — Progress Notes (Signed)
  Date: 05/30/2017  Patient name: Alfredia ClientJohn N Durkin  Medical record number: [id number]  Date of birth: 14-Jul-1940   I have seen and evaluated this patient and I have discussed the plan of care with the house staff. Please see their note for complete details. I concur with their findings with the following additions/corrections:   Still no progress, significant NG tube output, reports passing flatus, but no BM. Appreciate surgical consultation, considering operative intervention in the next day or two if no progress.   Afib remains relatively well rate controlled, monitoring electrolytes and UOP closely to ensure adequately repleted and hydrated. AKI has resolved.  Jessy OtoAlexander Raines, M.D., Ph.D. 05/30/2017, 9:20 PM

## 2017-05-30 NOTE — Progress Notes (Addendum)
[facility] Progress Note     Subjective: CC- NG tube Patient states that overall he is feeling better today. Passing small amount of flatus, no BM. Denies n/v. Denies abdominal pain or bloating. States that he feels empty. Asking if NG tube can come out.  Xray today shows persistent distal small bowel obstruction. WBC trending down 12.6 from 16.1, afebrile.  Objective: Vital signs in last 24 hours: Temp:  [97.5 F (36.4 C)-98.4 F (36.9 C)] 97.5 F (36.4 C) (03/26 0501) Pulse Rate:  [29-119] 97 (03/26 0501) Resp:  [14-31] 17 (03/26 0501) BP: (117-129)/(79-87) 129/79 (03/26 0501) SpO2:  [98 %-100 %] 100 % (03/26 0501) Weight:  [190 lb 14.7 oz (86.6 kg)] 190 lb 14.7 oz (86.6 kg) (03/26 0501) Last BM Date: 05/25/17  Intake/Output from previous day: 03/25 0701 - 03/26 0700 In: 2638.7 [I.V.:1913.7] Out: 1150 [Urine:500; Emesis/NG output:650] Intake/Output this shift: No intake/output data recorded.  PE: Gen:  Alert, NAD HEENT: EOM's intact, pupils equal and round Card:  Irregularly irregular Pulm:  CTAB, no W/R/R, effort normal Abd: Soft, NT, minimally distended, few BS heard, no HSM, no hernia Ext:  Calves soft and nontender Psych: A&Ox3  Skin: no rashes noted, warm and dry  Lab Results:  Recent Labs    05/29/17 0329 05/30/17 0333  WBC 16.1* 12.6*  HGB 11.6* 11.4*  HCT 39.2 38.5*  PLT 337 333   BMET Recent Labs    05/29/17 0329 05/30/17 0333  NA 138 144  K 4.3 3.9  CL 106 111  CO2 23 22  GLUCOSE 126* 103*  BUN 28* 23*  CREATININE 1.65* 1.38*  CALCIUM 8.1* 8.2*   PT/INR No results for input(s): LABPROT, INR in the last 72 hours. CMP     Component Value Date/Time   NA 144 05/30/2017 0333   NA 144 04/10/2017 1510   K 3.9 05/30/2017 0333   CL 111 05/30/2017 0333   CO2 22 05/30/2017 0333   GLUCOSE 103 (H) 05/30/2017 0333   BUN 23 (H) 05/30/2017 0333   BUN 13 04/10/2017 1510   CREATININE 1.38 (H) 05/30/2017 0333   CALCIUM 8.2 (L)  05/30/2017 0333   PROT 6.1 (L) 05/29/2017 0329   PROT 7.5 04/10/2017 1510   ALBUMIN 3.0 (L) 05/29/2017 0329   ALBUMIN 4.3 04/10/2017 1510   AST 16 05/29/2017 0329   ALT 10 (L) 05/29/2017 0329   ALKPHOS 57 05/29/2017 0329   BILITOT 1.2 05/29/2017 0329   BILITOT 0.4 04/10/2017 1510   GFRNONAA 47 (L) 05/30/2017 0333   GFRAA 55 (L) 05/30/2017 0333   Lipase     Component Value Date/Time   LIPASE 43 05/25/2017 1609       Studies/Results: Ct Abdomen Pelvis Wo Contrast  Result Date: 05/28/2017 CLINICAL DATA:  77 year old male with nausea and vomiting. EXAM: CT ABDOMEN AND PELVIS WITHOUT CONTRAST TECHNIQUE: Multidetector CT imaging of the abdomen and pelvis was performed following the standard protocol without IV contrast. COMPARISON:  CT Abdomen and Pelvis 05/25/2017. FINDINGS: Lower chest: Respiratory motion artifact at the lung bases today. No pericardial or pleural effusion. Mild dependent atelectasis. Hepatobiliary: Negative noncontrast liver. The gallbladder is diminutive or absent. Pancreas: Negative. Spleen: Negative. Adrenals/Urinary Tract: Normal adrenal glands. Stable bilateral kidneys, multiple simple fluid density renal cysts. Decompressed ureters. Urinary bladder today is decompressed by Foley catheter. There is mild residual bladder wall thickening. Stomach/Bowel: Retained stool in the rectum similar to the prior study. Intermittent decompressed sigmoid colon. Redundant proximal sigmoid colon, descending colon,  and splenic flexure. Decompressed transverse colon. Diverticulosis at the hepatic flexure and in the right colon but no active inflammation identified. Negative cecum and appendix. Decompressed terminal ileum and small bowel loops in the pelvis and right lower quadrant. Severe air and fluid distension of the stomach. Similar distension of the distal esophagus. Moderately distended duodenum and small bowel loops throughout the abdomen with an abrupt in the central abdomen near  the level of the umbilicus. See series 3 images 65 through 72. No abdominal free air or free fluid. Vascular/Lymphatic: Aortoiliac calcified atherosclerosis. Vascular patency is not evaluated in the absence of IV contrast. Left iliac artery aneurysm up to 34 millimeters diameter redemonstrated. No lymphadenopathy. Reproductive: Foley catheter in place. Prostate hypertrophy redemonstrated and stable. Sequelae of right inguinal hernia repair with small surgical clips. Other: No pelvic free fluid. Multiple probable small ventral abdominal wall subcutaneous injection sites, such as on series 3, image 63. Musculoskeletal: Heterogeneous bone mineralization in the lower thoracic spine, although many of the lucent thoracic vertebral areas appear to be endplate Schmorl's nodes. Advanced lower lumbar facet degeneration. Mildly heterogeneous sclerotic bone mineralization in the sacrum and pelvis. Degenerative appearing acetabular subchondral cyst on the right. No destructive osseous lesion identified. IMPRESSION: 1. High-grade Acute Small Bowel Obstruction with an abrupt transition point in the central abdomen at the level of the umbilicus. See series 3 images 65 through 72. Associated severe gas and fluid distention of the stomach. 2. No abdominal free air or free fluid. 3. Urinary bladder now decompressed by Foley catheter. 4. Otherwise stable noncontrast CT appearance of the abdomen and Pelvis since 05/25/2017, including aortic atherosclerosis and 3.4 cm left iliac artery aneurysm. Electronically Signed   By: Odessa Fleming M.D.   On: 05/28/2017 22:50   Dg Abd Portable 1v-small Bowel Obstruction Protocol-initial, 8 Hr Delay  Result Date: 05/29/2017 CLINICAL DATA:  Small-bowel obstruction protocol.  8 hour delay. EXAM: PORTABLE ABDOMEN - 1 VIEW COMPARISON:  None. FINDINGS: There is distended small bowel throughout the abdomen without significant change from the prior exam. There is no visualized oral contrast. IMPRESSION: No  change in the appearance of the partial small bowel obstruction. Electronically Signed   By: Amie Portland M.D.   On: 05/29/2017 21:05   Dg Abd Portable 1v-small Bowel Protocol-position Verification  Result Date: 05/29/2017 CLINICAL DATA:  77 year old male with nasogastric tube. Subsequent encounter. EXAM: PORTABLE ABDOMEN - 1 VIEW COMPARISON:  05/28/2017 plain film and CT. FINDINGS: Nasogastric tube tip gastric fundus with side hole just beyond the gastroesophageal junction. Gas distended small bowel consistent with small bowel obstruction as noted on recent CT. The possibility of free intraperitoneal air cannot be assessed on a supine view. IMPRESSION: Nasogastric tube tip gastric fundus with persistent small bowel obstructive pattern. Electronically Signed   By: Lacy Duverney M.D.   On: 05/29/2017 08:08   Dg Abd Portable 1v  Result Date: 05/28/2017 CLINICAL DATA:  Nasogastric tube placement. EXAM: PORTABLE ABDOMEN - 1 VIEW COMPARISON:  CT 05/28/2017. FINDINGS: 1715 hours. Examination concentrates on the upper abdomen. There is diffuse gaseous distention of the stomach, small bowel and colon. Enteric tube tip overlaps the proximal stomach. Side hole is near the GE junction IMPRESSION: Enteric tube extends to the level of the proximal stomach. Electronically Signed   By: Carey Bullocks M.D.   On: 05/28/2017 17:50   Dg Abd Portable 1v  Result Date: 05/28/2017 CLINICAL DATA:  Recent onset of vomiting. EXAM: PORTABLE ABDOMEN - 1 VIEW COMPARISON:  CT  scan May 25, 2017 FINDINGS: Dilated small bowel loops are identified with a paucity of colonic gas consistent with a small bowel obstruction. No free air, portal venous gas, or pneumatosis identified. IMPRESSION: Small bowel obstruction. These results will be called to the ordering clinician or representative by the Radiologist Assistant, and communication documented in the PACS or zVision Dashboard. Electronically Signed   By: Gerome Samavid  Williams III M.D   On:  05/28/2017 12:56    Anti-infectives: Anti-infectives (From admission, onward)   None       Assessment/Plan A-fib with RVR - on diltiazem Remote MI - holding plavix (last dose 3/23) CKD-creatinine improving HTN  SBO versus enteritis - no prior h/o abdominal surgery - CT suggest transition zone therefore mechanical problem. - XR this AM shows persistent distal small bowel obstruction - passing some flatus, no BM - WBC trending down, afebrile  No acute Need for surgical intervention, however leukocytosis is of some concern. Hold Plavix.  Last Plavix dose documented yesterday. Continue [place] heparin. Continue twice a day Protonix.  ID - none FEN - IVF, NPO/NGT VTE - SCDs, heparin Foley - in place  Plan - Abdomen soft and nontender this morning, WBC trending down, but xray shows persistent obstructive pattern. Continue NPO/NG tube; advance NG tube 5cm. Encourage OOB/mobility as able. Repeat abdominal xray tomorrow morning. Continue to hold plavix. If obstruction does not resolve may have to consider surgery in 24+ hours.   LOS: 5 days    Franne FortsBrooke A Graceson Nichelson , [facility] 05/30/2017, 8:41 AM Pager: [phone] Consults: [phone] Mon-Fri 7:00 am-4:30 pm Sat-Sun 7:00 am-11:30 am

## 2017-05-30 NOTE — Progress Notes (Signed)
NG tube advanced 5cm per order. 

## 2017-05-30 NOTE — Progress Notes (Signed)
PT Cancellation Note  Patient Details Name: Hayden Harmon MRN: [id number] DOB: [birth year]   Cancelled Treatment:    Reason Eval/Treat Not Completed: Patient declined, no reason specified.  "I already walked once today".  Therapist tried to explain that walking more than once would be good for him medically, but he was not interested in going again.  He was agreeable for PT to continue to check on him and ambulate as agreeable.  Thanks,    Rollene Rotundaebecca B. Lizette Pazos, PT, DPT [phone]   05/30/2017, 5:36 PM

## 2017-05-30 NOTE — Plan of Care (Signed)
  Problem: Health Behavior/Discharge Planning: Goal: Ability to manage health-related needs will improve Outcome: Progressing   Problem: Nutrition: Goal: Adequate nutrition will be maintained Outcome: Progressing   Problem: Safety: Goal: Ability to remain free from injury will improve Outcome: Progressing   Problem: Elimination: Goal: Will not experience complications related to bowel motility Outcome: Not Progressing Goal: Will not experience complications related to urinary retention Outcome: Not Progressing   Problem: Education: Goal: Knowledge of General Education information will improve Outcome: Completed/Met   Problem: Clinical Measurements: Goal: Respiratory complications will improve Outcome: Completed/Met   Problem: Pain Managment: Goal: General experience of comfort will improve Outcome: Completed/Met

## 2017-05-31 ENCOUNTER — Inpatient Hospital Stay (HOSPITAL_COMMUNITY): Payer: Medicare Other

## 2017-05-31 ENCOUNTER — Encounter (HOSPITAL_COMMUNITY)
Admission: EM | Disposition: A | Discharge status: Home Health | Payer: Self-pay | Source: Home / Self Care | Attending: Internal Medicine

## 2017-05-31 ENCOUNTER — Encounter (HOSPITAL_COMMUNITY): Payer: Self-pay | Admitting: Certified Registered Nurse Anesthetist

## 2017-05-31 ENCOUNTER — Inpatient Hospital Stay (HOSPITAL_COMMUNITY): Payer: Medicare Other | Admitting: Anesthesiology

## 2017-05-31 DIAGNOSIS — B9689 Other specified bacterial agents as the cause of diseases classified elsewhere: Secondary | ICD-10-CM

## 2017-05-31 DIAGNOSIS — K565 Intestinal adhesions [bands], unspecified as to partial versus complete obstruction: Secondary | ICD-10-CM

## 2017-05-31 DIAGNOSIS — R143 Flatulence: Secondary | ICD-10-CM

## 2017-05-31 DIAGNOSIS — N390 Urinary tract infection, site not specified: Secondary | ICD-10-CM

## 2017-05-31 HISTORY — PX: LAPAROTOMY: SHX154

## 2017-05-31 LAB — GRAM STAIN: Special Requests: NORMAL

## 2017-05-31 LAB — TYPE AND SCREEN
ABO/RH(D): O POS
Antibody Screen: NEGATIVE

## 2017-05-31 LAB — CBC
HEMATOCRIT: 38.4 % — AB (ref 39.0–52.0)
HEMOGLOBIN: 11.2 g/dL — AB (ref 13.0–17.0)
MCH: 18.5 pg — ABNORMAL LOW (ref 26.0–34.0)
MCHC: 29.2 g/dL — ABNORMAL LOW (ref 30.0–36.0)
MCV: 63.4 fL — ABNORMAL LOW (ref 78.0–100.0)
Platelets: 344 10*3/uL (ref 150–400)
RBC: 6.06 MIL/uL — AB (ref 4.22–5.81)
RDW: 22.5 % — ABNORMAL HIGH (ref 11.5–15.5)
WBC: 6.8 10*3/uL (ref 4.0–10.5)

## 2017-05-31 LAB — BASIC METABOLIC PANEL
ANION GAP: 12 (ref 5–15)
BUN: 17 mg/dL (ref 6–20)
CHLORIDE: 114 mmol/L — AB (ref 101–111)
CO2: 20 mmol/L — AB (ref 22–32)
Calcium: 8.3 mg/dL — ABNORMAL LOW (ref 8.9–10.3)
Creatinine, Ser: 1.24 mg/dL (ref 0.61–1.24)
GFR calc non Af Amer: 54 mL/min — ABNORMAL LOW (ref >60)
Glucose, Bld: 94 mg/dL (ref 65–99)
POTASSIUM: 3.8 mmol/L (ref 3.5–5.1)
SODIUM: 146 mmol/L — AB (ref 135–145)

## 2017-05-31 LAB — ABO/RH: ABO/RH(D): O POS

## 2017-05-31 LAB — GLUCOSE, CAPILLARY: Glucose-Capillary: 103 mg/dL — ABNORMAL HIGH (ref 65–99)

## 2017-05-31 SURGERY — LAPAROTOMY, EXPLORATORY
Anesthesia: General | Site: Abdomen

## 2017-05-31 MED ORDER — CEFOTETAN DISODIUM-DEXTROSE 2-2.08 GM-%(50ML) IV SOLR
INTRAVENOUS | Status: AC
  Filled 2017-05-31: qty 50

## 2017-05-31 MED ORDER — CHLORHEXIDINE GLUCONATE CLOTH 2 % EX PADS: 6.0000 | MEDICATED_PAD | Freq: Once | CUTANEOUS | Status: DC

## 2017-05-31 MED ORDER — HEPARIN SODIUM (PORCINE) 5000 UNIT/ML IJ SOLN
5000.0000 [IU] | Freq: Three times a day (TID) | INTRAMUSCULAR | Status: DC
  Administered 2017-06-01 – 2017-06-04 (×9): 5000 [IU] via SUBCUTANEOUS
  Filled 2017-05-31 (×8): qty 1

## 2017-05-31 MED ORDER — LACTATED RINGERS IV SOLN
INTRAVENOUS | Status: DC
  Administered 2017-05-31 – 2017-06-02 (×4): via INTRAVENOUS

## 2017-05-31 MED ORDER — LIDOCAINE 2% (20 MG/ML) 5 ML SYRINGE
INTRAMUSCULAR | Status: DC | PRN
  Administered 2017-05-31: 80 mg via INTRAVENOUS

## 2017-05-31 MED ORDER — DEXAMETHASONE SODIUM PHOSPHATE 10 MG/ML IJ SOLN
INTRAMUSCULAR | Status: DC | PRN
  Administered 2017-05-31: 10 mg via INTRAVENOUS

## 2017-05-31 MED ORDER — PROPOFOL 10 MG/ML IV BOLUS
INTRAVENOUS | Status: DC | PRN
  Administered 2017-05-31: 150 mg via INTRAVENOUS

## 2017-05-31 MED ORDER — ONDANSETRON HCL 4 MG/2ML IJ SOLN
INTRAMUSCULAR | Status: AC
  Filled 2017-05-31: qty 2

## 2017-05-31 MED ORDER — SUGAMMADEX SODIUM 200 MG/2ML IV SOLN
INTRAVENOUS | Status: DC | PRN
  Administered 2017-05-31: 300 mg via INTRAVENOUS

## 2017-05-31 MED ORDER — DEXTROSE IN LACTATED RINGERS 5 % IV SOLN: INTRAVENOUS | Status: DC

## 2017-05-31 MED ORDER — FENTANYL CITRATE (PF) 100 MCG/2ML IJ SOLN: 25.0000 ug | INTRAMUSCULAR | Status: DC | PRN

## 2017-05-31 MED ORDER — ACETAMINOPHEN 10 MG/ML IV SOLN
1000.0000 mg | Freq: Four times a day (QID) | INTRAVENOUS | Status: AC
  Administered 2017-05-31 – 2017-06-01 (×4): 1000 mg via INTRAVENOUS
  Filled 2017-05-31 (×4): qty 100

## 2017-05-31 MED ORDER — DEXTROSE 5 % IV SOLN
INTRAVENOUS | Status: DC | PRN
  Administered 2017-05-31: 20 ug/min via INTRAVENOUS

## 2017-05-31 MED ORDER — MIDAZOLAM HCL 2 MG/2ML IJ SOLN
INTRAMUSCULAR | Status: AC
  Filled 2017-05-31: qty 2

## 2017-05-31 MED ORDER — LACTATED RINGERS IV BOLUS
2000.0000 mL | Freq: Once | INTRAVENOUS | Status: AC
  Administered 2017-05-31: 2000 mL via INTRAVENOUS

## 2017-05-31 MED ORDER — CIPROFLOXACIN IN D5W 400 MG/200ML IV SOLN
400.0000 mg | Freq: Two times a day (BID) | INTRAVENOUS | Status: DC
  Administered 2017-05-31 – 2017-06-01 (×3): 400 mg via INTRAVENOUS
  Filled 2017-05-31 (×4): qty 200

## 2017-05-31 MED ORDER — SUCCINYLCHOLINE CHLORIDE 20 MG/ML IJ SOLN
INTRAMUSCULAR | Status: DC | PRN
  Administered 2017-05-31: 100 mg via INTRAVENOUS

## 2017-05-31 MED ORDER — PHENYLEPHRINE 40 MCG/ML (10ML) SYRINGE FOR IV PUSH (FOR BLOOD PRESSURE SUPPORT)
PREFILLED_SYRINGE | INTRAVENOUS | Status: AC
  Filled 2017-05-31: qty 10

## 2017-05-31 MED ORDER — SODIUM CHLORIDE 0.9 % IV SOLN
2.0000 g | INTRAVENOUS | Status: AC
  Administered 2017-05-31: 2 g via INTRAVENOUS
  Filled 2017-05-31 (×2): qty 2

## 2017-05-31 MED ORDER — DEXAMETHASONE SODIUM PHOSPHATE 10 MG/ML IJ SOLN
INTRAMUSCULAR | Status: AC
  Filled 2017-05-31: qty 1

## 2017-05-31 MED ORDER — ROCURONIUM BROMIDE 10 MG/ML (PF) SYRINGE
PREFILLED_SYRINGE | INTRAVENOUS | Status: DC | PRN
  Administered 2017-05-31: 50 mg via INTRAVENOUS

## 2017-05-31 MED ORDER — ONDANSETRON HCL 4 MG/2ML IJ SOLN
INTRAMUSCULAR | Status: DC | PRN
  Administered 2017-05-31: 4 mg via INTRAVENOUS

## 2017-05-31 MED ORDER — METHOCARBAMOL 1000 MG/10ML IJ SOLN
500.0000 mg | Freq: Three times a day (TID) | INTRAVENOUS | Status: DC
  Administered 2017-05-31 – 2017-06-05 (×16): 500 mg via INTRAVENOUS
  Filled 2017-05-31: qty 5
  Filled 2017-05-31: qty 550
  Filled 2017-05-31 (×16): qty 5

## 2017-05-31 MED ORDER — ONDANSETRON HCL 4 MG/2ML IJ SOLN: 4.0000 mg | Freq: Once | INTRAMUSCULAR | Status: DC | PRN

## 2017-05-31 MED ORDER — DEXTROSE IN LACTATED RINGERS 5 % IV SOLN
INTRAVENOUS | Status: DC
  Administered 2017-05-31: 13:00:00 via INTRAVENOUS

## 2017-05-31 MED ORDER — LACTATED RINGERS IV SOLN
INTRAVENOUS | Status: DC | PRN
  Administered 2017-05-31: 10:00:00 via INTRAVENOUS

## 2017-05-31 MED ORDER — CHLORHEXIDINE GLUCONATE CLOTH 2 % EX PADS
6.0000 | MEDICATED_PAD | Freq: Once | CUTANEOUS | Status: AC
  Administered 2017-05-31: 6 via TOPICAL

## 2017-05-31 MED ORDER — FENTANYL CITRATE (PF) 250 MCG/5ML IJ SOLN
INTRAMUSCULAR | Status: DC | PRN
  Administered 2017-05-31: 100 ug via INTRAVENOUS

## 2017-05-31 MED ORDER — 0.9 % SODIUM CHLORIDE (POUR BTL) OPTIME
TOPICAL | Status: DC | PRN
  Administered 2017-05-31 (×2): 1000 mL

## 2017-05-31 MED ORDER — MORPHINE SULFATE (PF) 2 MG/ML IV SOLN
1.0000 mg | INTRAVENOUS | Status: DC | PRN
  Administered 2017-05-31: 2 mg via INTRAVENOUS
  Administered 2017-06-01: 4 mg via INTRAVENOUS
  Filled 2017-05-31: qty 2
  Filled 2017-05-31: qty 1
  Filled 2017-05-31: qty 2

## 2017-05-31 MED ORDER — LACTATED RINGERS IV SOLN
INTRAVENOUS | Status: DC
  Administered 2017-05-31: 11:00:00 via INTRAVENOUS

## 2017-05-31 MED ORDER — FENTANYL CITRATE (PF) 250 MCG/5ML IJ SOLN
INTRAMUSCULAR | Status: AC
  Filled 2017-05-31: qty 5

## 2017-05-31 MED ORDER — PROPOFOL 10 MG/ML IV BOLUS
INTRAVENOUS | Status: AC
  Filled 2017-05-31: qty 20

## 2017-05-31 SURGICAL SUPPLY — 36 items
BLADE CLIPPER SURG (BLADE) ×2 IMPLANT
CANISTER SUCT 3000ML PPV (MISCELLANEOUS) ×3 IMPLANT
CHLORAPREP W/TINT 26ML (MISCELLANEOUS) ×3 IMPLANT
COVER SURGICAL LIGHT HANDLE (MISCELLANEOUS) ×3 IMPLANT
DRAPE LAPAROSCOPIC ABDOMINAL (DRAPES) ×3 IMPLANT
DRAPE WARM FLUID 44X44 (DRAPE) ×3 IMPLANT
DRSG OPSITE POSTOP 4X10 (GAUZE/BANDAGES/DRESSINGS) IMPLANT
DRSG OPSITE POSTOP 4X8 (GAUZE/BANDAGES/DRESSINGS) ×2 IMPLANT
ELECT BLADE 6.5 EXT (BLADE) IMPLANT
ELECT CAUTERY BLADE 6.4 (BLADE) ×3 IMPLANT
ELECT REM PT RETURN 9FT ADLT (ELECTROSURGICAL) ×3
ELECTRODE REM PT RTRN 9FT ADLT (ELECTROSURGICAL) ×1 IMPLANT
GLOVE EUDERMIC 7 POWDERFREE (GLOVE) ×3 IMPLANT
GOWN STRL REUS W/ TWL LRG LVL3 (GOWN DISPOSABLE) ×1 IMPLANT
GOWN STRL REUS W/ TWL XL LVL3 (GOWN DISPOSABLE) ×1 IMPLANT
GOWN STRL REUS W/TWL LRG LVL3 (GOWN DISPOSABLE) ×2
GOWN STRL REUS W/TWL XL LVL3 (GOWN DISPOSABLE) ×2
KIT BASIN OR (CUSTOM PROCEDURE TRAY) ×3 IMPLANT
KIT TURNOVER KIT B (KITS) ×3 IMPLANT
LIGASURE IMPACT 36 18CM CVD LR (INSTRUMENTS) IMPLANT
NS IRRIG 1000ML POUR BTL (IV SOLUTION) ×6 IMPLANT
PACK GENERAL/GYN (CUSTOM PROCEDURE TRAY) ×3 IMPLANT
PAD ARMBOARD 7.5X6 YLW CONV (MISCELLANEOUS) ×3 IMPLANT
SPECIMEN JAR LARGE (MISCELLANEOUS) IMPLANT
SPONGE LAP 18X18 X RAY DECT (DISPOSABLE) IMPLANT
STAPLER VISISTAT 35W (STAPLE) ×3 IMPLANT
SUCTION POOLE TIP (SUCTIONS) ×3 IMPLANT
SUT PDS AB 1 TP1 96 (SUTURE) ×6 IMPLANT
SUT SILK 2 0 SH CR/8 (SUTURE) ×3 IMPLANT
SUT SILK 2 0 TIES 10X30 (SUTURE) ×3 IMPLANT
SUT SILK 3 0 SH CR/8 (SUTURE) ×3 IMPLANT
SUT SILK 3 0 TIES 10X30 (SUTURE) ×3 IMPLANT
TOWEL OR 17X24 6PK STRL BLUE (TOWEL DISPOSABLE) ×3 IMPLANT
TOWEL OR 17X26 10 PK STRL BLUE (TOWEL DISPOSABLE) ×1 IMPLANT
TRAY FOLEY W/METER SILVER 16FR (SET/KITS/TRAYS/PACK) IMPLANT
YANKAUER SUCT BULB TIP NO VENT (SUCTIONS) IMPLANT

## 2017-05-31 NOTE — Progress Notes (Signed)
Physical Therapy Treatment Patient Details Name: Hayden Harmon MRN: [id number] DOB: 16-Aug-1940 Today's Date: 05/31/2017    History of Present Illness Pt is a 77 y.o. male admitted 05/25/17 with possibly up to 2-3 week h/o vomiting and diarrhea with >15 lb  weight loss in several weeks. Found to be in a-fib with RVR; also with AKI. CT with small bowel enteritis. 05/31/17 sx exploritory laparotomy. PMH includes chronic microcytic anemia, HTN, CKD, a-fib, gout.    PT Comments    Pt performed co treatment as he has poor tolerance to therapy.  Pt s/p exp lap today and report abdominal discomfort.  Pt educated on the benefits of mobility to improve strength, function and reduce complications from surgery.  Plan for SNF remains appropriate as patient will require increased time to recover due to his poor activity tolerance.    Follow Up Recommendations  SNF;Supervision/Assistance - 24 hour     Equipment Recommendations  None recommended by PT    Recommendations for Other Services OT consult     Precautions / Restrictions Precautions Precautions: Fall Restrictions Weight Bearing Restrictions: No    Mobility  Bed Mobility Overal bed mobility: Needs Assistance Bed Mobility: Rolling;Supine to Sit;Sit to Supine Rolling: Mod assist(VC for sequencing.  )   Supine to sit: Mod assist;HOB elevated;+2 for physical assistance(use of rail and asistance for trunk elevation.  ) Sit to supine: Mod assist;+2 for physical assistance   General bed mobility comments: Increased time and effort; sequencing for log roll to decrease pain.  Assistance for trunk elevation and LE advancement.    Transfers Overall transfer level: Needs assistance Equipment used: 2 person hand held assist Transfers: Sit to/from Stand Sit to Stand: Min assist;+2 physical assistance         General transfer comment: Min assistance for boosting into standing.  Pt with wide BOS and required cues to keep elbow close to core  to assist in transfer.  In standing patient took 3 steps to the left to advance toward [facility] before returning to supine.    Ambulation/Gait                 Stairs            Wheelchair Mobility    Modified Rankin (Stroke Patients Only)       Balance Overall balance assessment: Needs assistance Sitting-balance support: Bilateral upper extremity supported;Feet supported Sitting balance-Leahy Scale: Fair Sitting balance - Comments: EOB to adjust socks   Standing balance support: Bilateral upper extremity supported Standing balance-Leahy Scale: Poor Standing balance comment: B HHA for side stepping reliant on external support                            Cognition Arousal/Alertness: Awake/alert Behavior During Therapy: Flat affect Overall Cognitive Status: Impaired/Different from baseline Area of Impairment: Safety/judgement;Problem solving                         Safety/Judgement: Decreased awareness of safety;Decreased awareness of deficits   Problem Solving: Requires verbal cues General Comments: Pt continues to be reluctant to participate in therapy, very flat affect, but also making jokes with therapists "How do you feel? ...with my hands"      Exercises      General Comments General comments (skin integrity, edema, etc.): Pt's brother and sister present throughout the session - providing encouragement to Pt      Pertinent Vitals/Pain Pain  Assessment: 0-10 Pain Score: 7  Pain Location: abdomen Pain Descriptors / Indicators: Discomfort;Operative site guarding;Sore Pain Intervention(s): Monitored during session;Repositioned;Premedicated before session    Home Living                      Prior Function            PT Goals (current goals can now be found in the care plan section) Acute Rehab PT Goals Patient Stated Goal: Return home Potential to Achieve Goals: Good Progress towards PT goals: Progressing toward goals     Frequency    Min 3X/week      PT Plan Current plan remains appropriate    Co-evaluation PT/OT/SLP Co-Evaluation/Treatment: Yes Reason for Co-Treatment: Complexity of the patient's impairments (multi-system involvement);Necessary to address cognition/behavior during functional activity;For patient/therapist safety;To address functional/ADL transfers PT goals addressed during session: Mobility/safety with mobility OT goals addressed during session: ADL's and self-care      AM-PAC PT "6 Clicks" Daily Activity  Outcome Measure  Difficulty turning over in bed (including adjusting bedclothes, sheets and blankets)?: Unable Difficulty moving from lying on back to sitting on the side of the bed? : Unable Difficulty sitting down on and standing up from a chair with arms (e.g., wheelchair, bedside commode, etc,.)?: Unable Help needed moving to and from a bed to chair (including a wheelchair)?: A Little Help needed walking in hospital room?: A Little Help needed climbing 3-5 steps with a railing? : A Lot 6 Click Score: 11    End of Session Equipment Utilized During Treatment: Gait belt Activity Tolerance: Patient limited by pain;Other (comment) Patient left: in bed;with bed alarm set;with call bell/phone within reach(Pt in chair position. ) Nurse Communication: Mobility status PT Visit Diagnosis: Other abnormalities of gait and mobility (R26.89);Muscle weakness (generalized) (M62.81)     Time: [id number] PT Time Calculation (min) (ACUTE ONLY): 23 min  Charges:  $Therapeutic Activity: 8-22 mins                    G Codes:       Joycelyn RuaAimee Javarri Segal, PTA pager [phone]    Florestine AversAimee J Caius Silbernagel 05/31/2017, 6:26 PM

## 2017-05-31 NOTE — Progress Notes (Signed)
   Subjective:  Patient seen and examined at bedside. Denies abdominal pain or vomiting since NG tube has been placed. States he is passing gas, no BM. Continues to have large volume output from NG tube noted at bedside. No other acute complaints.   Objective:  Vital signs in last 24 hours: Vitals:   05/31/17 1230 05/31/17 1235 05/31/17 1239 05/31/17 1255  BP:  (!) 155/91  (!) 156/83  Pulse: (!) 117 (!) 105  (!) 127  Resp: 18 (!) 24  20  Temp:   97.8 F (36.6 C) 97.7 F (36.5 C)  TempSrc:    Oral  SpO2: 99% 93%  100%  Weight:      Height:       General: Chronically-ill appearing man resting in bed. Drowsy.  HEENT: +NGT in  place Cardiac: Tachycardic, IRIR Pulm: normal effort, breathing comfortably on RA Abd: Clean dry dressings overlying midline incision. TTP.  Ext: extremities well perfused, no mottling   Assessment/Plan:  Principal Problem:   Small bowel obstruction (HCC) Active Problems:   Acute renal failure (HCC)   Atrial fibrillation with RVR (HCC)   Enteritis   Hyponatremia   Prostate enlargement   Aortic atherosclerosis (HCC)  Mechanical small bowel obstruction secondary to internal adhesions Small Bowel Enteritis Patient POD0 ex-lap with lysis of adhesions without immediate complication. Clear-cut mechanical SBO secondary to benign adhesions appreciated and treated. Stool able to pass from small bowel to ileocecal valve without issue. Colon without obvious issue and no mass was palpated. No ischemic bowel reported. NGT still in place, will defer to gen surg regarding clamping trials, etc.  -Gen Surgery on, appreciate their assistance -Resume Plavix at surgery teams discretion -Continue IV Protonix BID -NPO -IVF: LR 125 cc/hr  Atrial Fibrillation with RVR Likely secondary to hypovolemia, acute illness, and not being able to tolerate PO medications. Currently rate controlled on diltiazem infusion. Will work towards transitioning back to his home PO Cardizem  once tolerating PO intake. -Cardiac monitoring -Continue diltiazem infusion -Holding Cardizem 240 mg daily as NPO -SQ heparin (to be resumed at midnight tonight)  UTI RN notified us that patients urine was exceptionally foul, cloudy and pt had dysuria. UA with +Nitrites, +Leukocytes, +Bacteria, 6-30 WBCs and TNTC RBCs although urine appeared yellow. Culture pending but gram stain with WBC and gram variable rods. Awaiting final culture data, but does not have history of MDR organisms.  -Cipro -Follow culture data  AKI Resolved.  AKI in setting of dehydration/poor PO intake. -AM BMETs -Follow UOP  Chronic Microcytic Anemia Minimal blood loss recorded during procedure. Hgb at baseline of 11.9  MCV 60s. Iron studies consistent with iron deficiency anemia. Might benefit from parental iron at some point.  -CBC in AM  HTN  Hypertensive today, will monitor but likely resume lisinopril tomorrow -Continue to hold lisinopril  DVT ppx: [place] heparin Diet: NPO  Code Status: full code, confirmed on admission  Dispo: Anticipated discharge in approximately 3-4 day(s).   Sunil Hue, DO 05/31/2017, 1:16 PM Pager: [phone]

## 2017-05-31 NOTE — Transfer of Care (Signed)
Immediate Anesthesia Transfer of Care Note  Patient: Hayden Harmon  Procedure(s) Performed: EXPLORATORY LAPAROTOMY, LYSIS OF ADHESIONS (N/A Abdomen)  Patient Location: PACU  Anesthesia Type:General  Level of Consciousness: drowsy and patient cooperative  Airway & Oxygen Therapy: Patient Spontanous Breathing and Patient connected to nasal cannula oxygen  Post-op Assessment: Report given to RN, Post -op Vital signs reviewed and stable and Patient moving all extremities X 4  Post vital signs: Reviewed and stable  Last Vitals:  Vitals Value Taken Time  BP 147/80 05/31/2017 11:50 AM  Temp    Pulse 129 05/31/2017 11:53 AM  Resp 21 05/31/2017 11:53 AM  SpO2 94 % 05/31/2017 11:53 AM  Vitals shown include unvalidated device data.  Last Pain:  Vitals:   05/31/17 0800  TempSrc: Oral  PainSc:          Complications: No apparent anesthesia complications

## 2017-05-31 NOTE — H&P (View-Only) (Signed)
Subjective: Stable and alert.  Denies pain.  No stools with said he passed a little bit of flatus. NG output low. Abdominal x-rays still look like high-grade distal SBO. Review of CT scan March 24 shows abrupt transition zone in central abdomen at level of umbilicus.  Felt to be high-grade acute small bowel obstruction.  No tumor seen. WBC 6800.  Hemoglobin 11.2.  Patient advised undergo laparotomy today and he agrees. Last Plavix March 24  Objective: Vital signs in last 24 hours: Temp:  [97.3 F (36.3 C)-98.6 F (37 C)] 98.2 F (36.8 C) (03/27 0800) Pulse Rate:  [58-109] 104 (03/27 0800) Resp:  [18-26] 18 (03/27 0800) BP: (129-147)/(76-99) 129/91 (03/27 0800) SpO2:  [99 %-100 %] 100 % (03/27 0800) Weight:  [86.8 kg (191 lb 5.8 oz)] 86.8 kg (191 lb 5.8 oz) (03/27 0131) Last BM Date: 05/25/17  Intake/Output from previous day: 03/26 0701 - 03/27 0700 In: 1619.5 [I.V.:1619.5] Out: 3900 [Urine:1450; Emesis/NG output:2450] Intake/Output this shift: Total I/O In: 200 [IV Piggyback:200] Out: 250 [Urine:250]   PE: Gen:  Alert, NAD HEENT: EOM's intact, pupils equal and round, NG in place Card:  Irregularly irregular Pulm:  CTAB, no W/R/R, effort normal Abd: Soft, NT, mild to moderately distended, few BS heard, no HSM, no hernia Ext:  Calves soft and nontender Psych: A&Ox3  Skin: no rashes noted, warm and dry     Lab Results:  Recent Labs    05/30/17 0333 05/31/17 0414  WBC 12.6* 6.8  HGB 11.4* 11.2*  HCT 38.5* 38.4*  PLT 333 344   BMET Recent Labs    05/30/17 0333 05/31/17 0414  NA 144 146*  K 3.9 3.8  CL 111 114*  CO2 22 20*  GLUCOSE 103* 94  BUN 23* 17  CREATININE 1.38* 1.24  CALCIUM 8.2* 8.3*   PT/INR No results for input(s): LABPROT, INR in the last 72 hours. ABG No results for input(s): PHART, HCO3 in the last 72 hours.  Invalid input(s): PCO2, PO2  Studies/Results: Dg Abd Portable 1v  Result Date: 05/30/2017 CLINICAL DATA:  NG tube  placement. EXAM: PORTABLE ABDOMEN - 1 VIEW COMPARISON:  05/30/2017. FINDINGS: NG tube has been slightly advanced, its tip and side hole within the stomach. Severely dilated loops of small bowel again noted. Small bowel is dilated up to 5.0 cm. No free air identified. Degenerative change thoracic spine. IMPRESSION: NG tube has been slightly advanced, its tip in the side hole within the stomach. 2.  Persistent severely dilated loops of small bowel. Electronically Signed   By: Thomas  Register   On: 05/30/2017 15:21   Dg Abd Portable 1v-small Bowel Obstruction Protocol-24 Hr Delay  Result Date: 05/30/2017 CLINICAL DATA:  Small bowel obstruction. Nasogastric tube placement. EXAM: PORTABLE ABDOMEN - 1 VIEW COMPARISON:  Supine abdominal radiograph of May 29, 2017 FINDINGS: The esophagogastric tubes proximal port projects at the level of the GE junction with the tip in the cardia. There is moderate gaseous distention of the stomach. There are loops of moderately distended gas-filled small bowel stacked throughout the abdomen similar to that seen yesterday. No free extraluminal gas collections are observed. IMPRESSION: Advancement of the nasogastric tube by 5-10 cm would assure that the proximal port remains below the GE junction. Persistent distal small bowel obstruction. Electronically Signed   By: David  Jordan M.D.   On: 05/30/2017 09:00   Dg Abd Portable 1v-small Bowel Obstruction Protocol-initial, 8 Hr Delay  Result Date: 05/29/2017 CLINICAL DATA:  Small-bowel obstruction protocol.    8 hour delay. EXAM: PORTABLE ABDOMEN - 1 VIEW COMPARISON:  None. FINDINGS: There is distended small bowel throughout the abdomen without significant change from the prior exam. There is no visualized oral contrast. IMPRESSION: No change in the appearance of the partial small bowel obstruction. Electronically Signed   By: David  Ormond M.D.   On: 05/29/2017 21:05    Anti-infectives: Anti-infectives (From admission, onward)    Start     Dose/Rate Route Frequency Ordered Stop   05/31/17 0600  ciprofloxacin (CIPRO) IVPB 400 mg     400 mg 200 mL/hr over 60 Minutes Intravenous Every 12 hours 05/31/17 0543        Assessment/Plan: s/p Procedure(s): EXPLORATORY LAPAROTOMY   A-fib with RVR - on diltiazem Remote MI - holding plavix (last dose 3/23) CKD-creatinine improving HTN  SBO versus enteritis - no prior h/o abdominal surgery - CT suggest transition zone therefore mechanical problem. - XR this AM shows persistent distal small bowel obstruction - passing some flatus, no BM - WBCdown, afebrile  -Strongly suspect mechanical SBO given persistence of radiographic findings and physical exam. -Differential diagnosis includes internal hernia, tumor, primary adhesions given the absence of prior abdominal surgery- -I have advised and undergo laparotomy and treatment of his SBO today.  I told him that most likely he had adhesions but could not rule out tumor.  I told him there was a small possibility that this was an ileus masquerading as SBO and the that the laparotomy may be negative.  I discussed the indications, details, techniques, and numerous risk of the surgery with him.  He is aware the risk of bleeding, infection, negative diagnosis, wound healing problems such as hernia or dehiscence, cardiac pulmonary and thromboembolic problems.  He understands all of these issues.  All his questions are answered.  He wants to have this done and would like to go ahead today.  ID - none FEN - IVF, NPO/NGT VTE - SCDs, heparin Foley - in place  Plan - laparotomy today..     LOS: 6 days    Megan Hayduk M Brooklin Rieger 05/31/2017 

## 2017-05-31 NOTE — Anesthesia Preprocedure Evaluation (Signed)
Anesthesia Evaluation  Patient identified by MRN, date of birth, ID band Patient awake    Reviewed: Allergy & Precautions, NPO status , Patient's Chart, lab work & pertinent test results  Airway Mallampati: II  TM Distance: >3 FB Neck ROM: Full    Dental  (+) Dental Advisory Given, Missing   Pulmonary neg pulmonary ROS,    Pulmonary exam normal breath sounds clear to auscultation       Cardiovascular hypertension, Pt. on medications + Past MI and + Peripheral Vascular Disease  + dysrhythmias (Diltiazem gtt) Atrial Fibrillation  Rhythm:Irregular Rate:Abnormal     Neuro/Psych negative neurological ROS  negative psych ROS   GI/Hepatic negative GI ROS, Neg liver ROS,   Endo/Other  negative endocrine ROS  Renal/GU Renal InsufficiencyRenal disease     Musculoskeletal negative musculoskeletal ROS (+)   Abdominal   Peds  Hematology  (+) Blood dyscrasia (Plavix), anemia ,   Anesthesia Other Findings Day of surgery medications reviewed with the patient.  Reproductive/Obstetrics                             Anesthesia Physical Anesthesia Plan  ASA: IV  Anesthesia Plan: General   Post-op Pain Management:    Induction: Intravenous and Rapid sequence  PONV Risk Score and Plan: 3 and Dexamethasone, Ondansetron and Treatment may vary due to age or medical condition  Airway Management Planned: Oral ETT  Additional Equipment:   Intra-op Plan:   Post-operative Plan: Possible Post-op intubation/ventilation  Informed Consent: I have reviewed the patients History and Physical, chart, labs and discussed the procedure including the risks, benefits and alternatives for the proposed anesthesia with the patient or authorized representative who has indicated his/her understanding and acceptance.   Dental advisory given  Plan Discussed with: CRNA  Anesthesia Plan Comments:         Anesthesia  Quick Evaluation

## 2017-05-31 NOTE — Progress Notes (Signed)
PT Cancellation Note  Patient Details Name: Hayden Harmon MRN: [id number] DOB: [birth year]   Cancelled Treatment:    Reason Eval/Treat Not Completed: (P) Patient at procedure or test/unavailable(Pt in PACU post exp lap.  Will f/u per POC.  )   Yoshiko Keleher Artis DelayJ Armanda Forand 05/31/2017, 12:38 PM  Joycelyn RuaAimee Joyceann Kruser, PTA pager [phone]

## 2017-05-31 NOTE — Anesthesia Procedure Notes (Signed)
Procedure Name: Intubation Date/Time: 05/31/2017 10:55 AM Performed by: Julieta Bellini, CRNA Pre-anesthesia Checklist: Patient identified, Emergency Drugs available, Suction available and Patient being monitored Patient Re-evaluated:Patient Re-evaluated prior to induction Oxygen Delivery Method: Circle system utilized Preoxygenation: Pre-oxygenation with 100% oxygen Induction Type: IV induction, Rapid sequence and Cricoid Pressure applied Laryngoscope Size: Mac and 4 Grade View: Grade I Tube type: Oral Tube size: 7.5 mm Number of attempts: 1 Airway Equipment and Method: Stylet Placement Confirmation: ETT inserted through vocal cords under direct vision,  positive ETCO2 and breath sounds checked- equal and bilateral Secured at: 24 cm Tube secured with: Tape Dental Injury: Teeth and Oropharynx as per pre-operative assessment

## 2017-05-31 NOTE — Progress Notes (Signed)
OT Cancellation Note  Patient Details Name: Hayden Harmon MRN: [id number] DOB: December 02, 1940   Cancelled Treatment:    Reason Eval/Treat Not Completed: Patient at procedure or test/ unavailable(OR)  Evern BioLaura J Darvell Monteforte 05/31/2017, 12:16 PM  Sherryl MangesLaura Issac Moure OTR/L [phone]

## 2017-05-31 NOTE — Progress Notes (Signed)
Pharmacy Antibiotic Note  Hayden Harmon is a 77 y.o. male admitted on 05/25/2017 with Afib and enteritis, now w/ UTI.  Pharmacy has been consulted for Cipro dosing.  Plan: Cipro 400mg  IV Q12H.  Height: 5\' 11"  (180.3 cm) Weight: 191 lb 5.8 oz (86.8 kg) IBW/kg (Calculated) : 75.3  Temp (24hrs), Avg:98.2 F (36.8 C), Min:97.3 F (36.3 C), Max:98.6 F (37 C)  Recent Labs  Lab 05/26/17 0454 05/26/17 1151 05/27/17 0415 05/28/17 0751 05/29/17 0329 05/30/17 0333 05/31/17 0414  WBC 10.5  --  8.3  --  16.1* 12.6* 6.8  CREATININE 2.50* 2.02* 1.66* 1.50* 1.65* 1.38*  --     Estimated Creatinine Clearance: 47 mL/min (A) (by C-G formula based on SCr of 1.38 mg/dL (H)).    No Known Allergies    Thank you for allowing pharmacy to be a part of this patient's care.  Hayden GamblesVeronda Kevyn Boquet, PharmD, BCPS  05/31/2017 5:42 AM

## 2017-05-31 NOTE — Progress Notes (Addendum)
  Date: 05/31/2017  Patient name: Hayden Harmon  Medical record number: [id number]  Date of birth: 1941/02/20   I have seen and evaluated this patient and I have discussed the plan of care with the house staff. Please see their note for complete details. I concur with their findings with the following additions/corrections:   Abdominal x-ray today showed no progress and his SBO, and surgery took him to the OR today for exploratory laparotomy and lysis of adhesions. They found mechanical SBO due to benign adhesions, unclear underlying etiology of these adhesions. After lysis of adhesions, they ran the bowel and found no other pathology.  When I saw him after surgery, he was resting comfortably in bed. His incision was dressed with a small amount of bloody drainage, but otherwise clean, dry, and intact. His abdomen remains somewhat distended with rare bowel sounds, but nontender to palpation except along his incision.  Appreciate excellent surgical care as described above to hopefully resolve his SBO, and we will defer to surgery for advancement of his diet.  He has developed dysuria, and UA today shows positive LE and nitrites, predominately RBCs on microscopy. Urine Gram stain shows gram variable rods, culture is pending. No fevers. We'll treat for UTI and narrow antibiotics based on culture data.  A. fib currently well rate controlled on diltiazem drip, will hopefully be able to resume oral medications soon.  Jessy OtoAlexander Raines, M.D., Ph.D. 05/31/2017, 4:05 PM

## 2017-05-31 NOTE — Progress Notes (Signed)
Urinalysis positive for bacteria. Was started on Cipro IV every 12 hours. Asked on call physician if foley catheter should be removed and they stated they would relay message to day shift physicians for them to decide.   Harriet Massonavidson, Xxavier Noon E, RN

## 2017-05-31 NOTE — Progress Notes (Signed)
Occupational Therapy Treatment Patient Details Name: Hayden Harmon MRN: [id number] DOB: Dec 08, 1940 Today's Date: 05/31/2017    History of present illness Pt is a 77 y.o. male admitted 05/25/17 with possibly up to 2-3 week h/o vomiting and diarrhea with >15 lb  weight loss in several weeks. Found to be in a-fib with RVR; also with AKI. CT with small bowel enteritis. 05/31/17 sx exploritory laparotomy. PMH includes chronic microcytic anemia, HTN, CKD, a-fib, gout.   OT comments  Pt progressing towards OT goals this session (which remain appropriate post-sx today). Pt able to come EOB with mod A, perform grooming with set up at the EOB and perform sit to stand transfer with min A +2. Pt flat affect throughout (but maybe baseline personality?!) and continues to require SNF level therapy at discharge to maximize safety and independence in ADL/functional transfers.    Follow Up Recommendations  SNF;Supervision/Assistance - 24 hour    Equipment Recommendations  Other (comment)(defer to next venue)    Recommendations for Other Services      Precautions / Restrictions Precautions Precautions: Fall Restrictions Weight Bearing Restrictions: No       Mobility Bed Mobility Overal bed mobility: Needs Assistance Bed Mobility: Rolling;Supine to Sit;Sit to Supine Rolling: Mod assist(vc for sequencing)   Supine to sit: Mod assist;HOB elevated(use of rail and assist for trunk elevation) Sit to supine: Mod assist(vc for sequencing and assist for BLE back into bed)   General bed mobility comments: Increased time and effort; sequencing for log roll to decrease pain  Transfers Overall transfer level: Needs assistance Equipment used: 2 person hand held assist Transfers: Sit to/from Stand Sit to Stand: Min assist;+2 safety/equipment         General transfer comment: min A to stand and take small steps up the side of the bed.    Balance Overall balance assessment: Needs  assistance Sitting-balance support: Bilateral upper extremity supported;Feet supported Sitting balance-Leahy Scale: Fair Sitting balance - Comments: EOB to adjust socks   Standing balance support: Bilateral upper extremity supported Standing balance-Leahy Scale: Poor Standing balance comment: face to face for side stepping reliant on external support                           ADL either performed or assessed with clinical judgement   ADL Overall ADL's : Needs assistance/impaired Eating/Feeding: NPO   Grooming: Oral care;Wash/dry face;Set up;Sitting Grooming Details (indicate cue type and reason): EOB                 Toilet Transfer: Minimal assistance;Stand-pivot Toilet Transfer Details (indicate cue type and reason): simulated           General ADL Comments: Pt able to perform tasks, but requires max encouragement for participation in session     Vision       Perception     Praxis      Cognition Arousal/Alertness: Awake/alert Behavior During Therapy: Flat affect Overall Cognitive Status: Impaired/Different from baseline Area of Impairment: Safety/judgement;Problem solving                         Safety/Judgement: Decreased awareness of safety;Decreased awareness of deficits   Problem Solving: Requires verbal cues General Comments: Pt continues to be reluctant to participate in therapy, very flat affect, but also making jokes with therapists "How do you feel? ...with my hands"        Exercises  Shoulder Instructions       General Comments Pt's brother and sister present throughout the session - providing encouragement to Pt    Pertinent Vitals/ Pain       Pain Assessment: 0-10 Pain Score: 7  Pain Location: abdomen Pain Descriptors / Indicators: Discomfort;Operative site guarding;Sore Pain Intervention(s): Monitored during session;Repositioned;Premedicated before session  Home Living                                           Prior Functioning/Environment              Frequency  Min 2X/week        Progress Toward Goals  OT Goals(current goals can now be found in the care plan section)  Progress towards OT goals: Progressing toward goals  Acute Rehab OT Goals Patient Stated Goal: Return home OT Goal Formulation: With patient Time For Goal Achievement: 06/10/17 Potential to Achieve Goals: Fair  Plan Discharge plan remains appropriate;Frequency remains appropriate    Co-evaluation    PT/OT/SLP Co-Evaluation/Treatment: Yes Reason for Co-Treatment: For patient/therapist safety;To address functional/ADL transfers;Other (comment)(activity tolerance) PT goals addressed during session: Mobility/safety with mobility;Balance;Strengthening/ROM OT goals addressed during session: ADL's and self-care;Strengthening/ROM      AM-PAC PT "6 Clicks" Daily Activity     Outcome Measure   Help from another person eating meals?: Total(NPO) Help from another person taking care of personal grooming?: A Little Help from another person toileting, which includes using toliet, bedpan, or urinal?: A Little Help from another person bathing (including washing, rinsing, drying)?: A Lot Help from another person to put on and taking off regular upper body clothing?: A Little Help from another person to put on and taking off regular lower body clothing?: A Lot 6 Click Score: 14    End of Session    OT Visit Diagnosis: Unsteadiness on feet (R26.81);Other abnormalities of gait and mobility (R26.89);Muscle weakness (generalized) (M62.81);Pain Pain - Right/Left: Left Pain - part of body: Ankle and joints of foot(and abdomen)   Activity Tolerance Patient limited by pain   Patient Left in bed;with call bell/phone within reach;with bed alarm set;with family/visitor present   Nurse Communication Mobility status        Time: 1610-9604 OT Time Calculation (min): 23 min  Charges: OT General  Charges $OT Visit: 1 Visit OT Treatments $Self Care/Home Management : 8-22 mins  Sherryl Manges OTR/L [phone]   Evern Bio Brazen Domangue 05/31/2017, 5:32 PM

## 2017-05-31 NOTE — Progress Notes (Signed)
[facility] Progress Note     Subjective: CC-  Patient reports no changes. He reports no abdominal pain. Passing small amount of flatus, no BM. Denies abdominal distension or n/v.  Xray today shows persistent distal small bowel obstruction. WBC WNL, afebrile.  Objective: Vital signs in last 24 hours: Temp:  [97.3 F (36.3 C)-98.6 F (37 C)] 98.2 F (36.8 C) (03/27 0800) Pulse Rate:  [58-109] 104 (03/27 0800) Resp:  [18-26] 18 (03/27 0800) BP: (129-147)/(76-99) 129/91 (03/27 0800) SpO2:  [99 %-100 %] 100 % (03/27 0800) Weight:  [191 lb 5.8 oz (86.8 kg)] 191 lb 5.8 oz (86.8 kg) (03/27 0131) Last BM Date: 05/25/17  Intake/Output from previous day: 03/26 0701 - 03/27 0700 In: 1619.5 [I.V.:1619.5] Out: 3900 [Urine:1450; Emesis/NG output:2450] Intake/Output this shift: Total I/O In: 200 [IV Piggyback:200] Out: 250 [Urine:250]  PE: Gen:  Alert, NAD HEENT: EOM's intact, pupils equal and round Card:  Irregularly irregular Pulm:  CTAB, no W/R/R, effort normal Abd: Soft, NT, minimally distended, hypoactive BS, no HSM, no hernia Ext:  Calves soft and nontender Psych: A&Ox3  Skin: no rashes noted, warm and dry  Lab Results:  Recent Labs    05/30/17 0333 05/31/17 0414  WBC 12.6* 6.8  HGB 11.4* 11.2*  HCT 38.5* 38.4*  PLT 333 344   BMET Recent Labs    05/30/17 0333 05/31/17 0414  NA 144 146*  K 3.9 3.8  CL 111 114*  CO2 22 20*  GLUCOSE 103* 94  BUN 23* 17  CREATININE 1.38* 1.24  CALCIUM 8.2* 8.3*   PT/INR No results for input(s): LABPROT, INR in the last 72 hours. CMP     Component Value Date/Time   NA 146 (H) 05/31/2017 0414   NA 144 04/10/2017 1510   K 3.8 05/31/2017 0414   CL 114 (H) 05/31/2017 0414   CO2 20 (L) 05/31/2017 0414   GLUCOSE 94 05/31/2017 0414   BUN 17 05/31/2017 0414   BUN 13 04/10/2017 1510   CREATININE 1.24 05/31/2017 0414   CALCIUM 8.3 (L) 05/31/2017 0414   PROT 6.1 (L) 05/29/2017 0329   PROT 7.5 04/10/2017 1510   ALBUMIN 3.0 (L) 05/29/2017 0329   ALBUMIN 4.3 04/10/2017 1510   AST 16 05/29/2017 0329   ALT 10 (L) 05/29/2017 0329   ALKPHOS 57 05/29/2017 0329   BILITOT 1.2 05/29/2017 0329   BILITOT 0.4 04/10/2017 1510   GFRNONAA 54 (L) 05/31/2017 0414   GFRAA >60 05/31/2017 0414   Lipase     Component Value Date/Time   LIPASE 43 05/25/2017 1609       Studies/Results: Dg Abd Portable 1v  Result Date: 05/30/2017 CLINICAL DATA:  NG tube placement. EXAM: PORTABLE ABDOMEN - 1 VIEW COMPARISON:  05/30/2017. FINDINGS: NG tube has been slightly advanced, its tip and side hole within the stomach. Severely dilated loops of small bowel again noted. Small bowel is dilated up to 5.0 cm. No free air identified. Degenerative change thoracic spine. IMPRESSION: NG tube has been slightly advanced, its tip in the side hole within the stomach. 2.  Persistent severely dilated loops of small bowel. Electronically Signed   By: Maisie Fushomas  Register   On: 05/30/2017 15:21   Dg Abd Portable 1v-small Bowel Obstruction Protocol-24 Hr Delay  Result Date: 05/30/2017 CLINICAL DATA:  Small bowel obstruction. Nasogastric tube placement. EXAM: PORTABLE ABDOMEN - 1 VIEW COMPARISON:  Supine abdominal radiograph of May 29, 2017 FINDINGS: The esophagogastric tubes proximal port projects at the level of the GE  junction with the tip in the cardia. There is moderate gaseous distention of the stomach. There are loops of moderately distended gas-filled small bowel stacked throughout the abdomen similar to that seen yesterday. No free extraluminal gas collections are observed. IMPRESSION: Advancement of the nasogastric tube by 5-10 cm would assure that the proximal port remains below the GE junction. Persistent distal small bowel obstruction. Electronically Signed   By: David  [place] M.D.   On: 05/30/2017 09:00   Dg Abd Portable 1v-small Bowel Obstruction Protocol-initial, 8 Hr Delay  Result Date: 05/29/2017 CLINICAL DATA:  Small-bowel  obstruction protocol.  8 hour delay. EXAM: PORTABLE ABDOMEN - 1 VIEW COMPARISON:  None. FINDINGS: There is distended small bowel throughout the abdomen without significant change from the prior exam. There is no visualized oral contrast. IMPRESSION: No change in the appearance of the partial small bowel obstruction. Electronically Signed   By: Amie Portland M.D.   On: 05/29/2017 21:05    Anti-infectives: Anti-infectives (From admission, onward)   Start     Dose/Rate Route Frequency Ordered Stop   05/31/17 0600  ciprofloxacin (CIPRO) IVPB 400 mg     400 mg 200 mL/hr over 60 Minutes Intravenous Every 12 hours 05/31/17 0543         Assessment/Plan A-fib with RVR - on diltiazem Remote MI - holding plavix (last dose 3/23) CKD-creatinine improving HTN UTI - started on cipro 3/27  SBO  - no prior h/o abdominal surgery - CT suggest transition zone therefore mechanical problem. - XR this AM shows persistent distal small bowel obstruction - passing some flatus, no BM - WBC 6.8, afebrile  ID - none FEN - IVF, NPO/NGT VTE - SCDs, heparin Foley - in place  Plan - Abdomen still soft and nontender but patient with persistent obstructive findings on xray. SBO of uncertain etiology not resolving with several days of NG tube decompression. Discussed options and patient agreeable to proceed with exploratory laparotomy. Hold subQ heparin.    LOS: 6 days    Franne Forts , [facility] 05/31/2017, 8:44 AM Pager: [phone] Consults: [phone] Mon-Fri 7:00 am-4:30 pm Sat-Sun 7:00 am-11:30 am

## 2017-05-31 NOTE — Interval H&P Note (Signed)
History and Physical Interval Note:  05/31/2017 10:22 AM  Hayden Harmon  has presented today for surgery, with the diagnosis of SMALL BOWEL OBSTRUCTION  The various methods of treatment have been discussed with the patient and family. After consideration of risks, benefits and other options for treatment, the patient has consented to  Procedure(s): EXPLORATORY LAPAROTOMY (N/A) as a surgical intervention .  The patient's history has been reviewed, patient examined, no change in status, stable for surgery.  I have reviewed the patient's chart and labs.  Questions were answered to the patient's satisfaction.     Ernestene MentionHaywood M Marquie Aderhold

## 2017-05-31 NOTE — Progress Notes (Signed)
Subjective: Stable and alert.  Denies pain.  No stools with said he passed a little bit of flatus. NG output low. Abdominal x-rays still look like high-grade distal SBO. Review of CT scan March 24 shows abrupt transition zone in central abdomen at level of umbilicus.  Felt to be high-grade acute small bowel obstruction.  No tumor seen. WBC 6800.  Hemoglobin 11.2.  Patient advised undergo laparotomy today and he agrees. Last Plavix March 24  Objective: Vital signs in last 24 hours: Temp:  [97.3 F (36.3 C)-98.6 F (37 C)] 98.2 F (36.8 C) (03/27 0800) Pulse Rate:  [58-109] 104 (03/27 0800) Resp:  [18-26] 18 (03/27 0800) BP: (129-147)/(76-99) 129/91 (03/27 0800) SpO2:  [99 %-100 %] 100 % (03/27 0800) Weight:  [86.8 kg (191 lb 5.8 oz)] 86.8 kg (191 lb 5.8 oz) (03/27 0131) Last BM Date: 05/25/17  Intake/Output from previous day: 03/26 0701 - 03/27 0700 In: 1619.5 [I.V.:1619.5] Out: 3900 [Urine:1450; Emesis/NG output:2450] Intake/Output this shift: Total I/O In: 200 [IV Piggyback:200] Out: 250 [Urine:250]   PE: Gen:  Alert, NAD HEENT: EOM's intact, pupils equal and round, NG in place Card:  Irregularly irregular Pulm:  CTAB, no W/R/R, effort normal Abd: Soft, NT, mild to moderately distended, few BS heard, no HSM, no hernia Ext:  Calves soft and nontender Psych: A&Ox3  Skin: no rashes noted, warm and dry     Lab Results:  Recent Labs    05/30/17 0333 05/31/17 0414  WBC 12.6* 6.8  HGB 11.4* 11.2*  HCT 38.5* 38.4*  PLT 333 344   BMET Recent Labs    05/30/17 0333 05/31/17 0414  NA 144 146*  K 3.9 3.8  CL 111 114*  CO2 22 20*  GLUCOSE 103* 94  BUN 23* 17  CREATININE 1.38* 1.24  CALCIUM 8.2* 8.3*   PT/INR No results for input(s): LABPROT, INR in the last 72 hours. ABG No results for input(s): PHART, HCO3 in the last 72 hours.  Invalid input(s): PCO2, PO2  Studies/Results: Dg Abd Portable 1v  Result Date: 05/30/2017 CLINICAL DATA:  NG tube  placement. EXAM: PORTABLE ABDOMEN - 1 VIEW COMPARISON:  05/30/2017. FINDINGS: NG tube has been slightly advanced, its tip and side hole within the stomach. Severely dilated loops of small bowel again noted. Small bowel is dilated up to 5.0 cm. No free air identified. Degenerative change thoracic spine. IMPRESSION: NG tube has been slightly advanced, its tip in the side hole within the stomach. 2.  Persistent severely dilated loops of small bowel. Electronically Signed   By: Maisie Fushomas  Register   On: 05/30/2017 15:21   Dg Abd Portable 1v-small Bowel Obstruction Protocol-24 Hr Delay  Result Date: 05/30/2017 CLINICAL DATA:  Small bowel obstruction. Nasogastric tube placement. EXAM: PORTABLE ABDOMEN - 1 VIEW COMPARISON:  Supine abdominal radiograph of May 29, 2017 FINDINGS: The esophagogastric tubes proximal port projects at the level of the GE junction with the tip in the cardia. There is moderate gaseous distention of the stomach. There are loops of moderately distended gas-filled small bowel stacked throughout the abdomen similar to that seen yesterday. No free extraluminal gas collections are observed. IMPRESSION: Advancement of the nasogastric tube by 5-10 cm would assure that the proximal port remains below the GE junction. Persistent distal small bowel obstruction. Electronically Signed   By: David  SwazilandJordan M.D.   On: 05/30/2017 09:00   Dg Abd Portable 1v-small Bowel Obstruction Protocol-initial, 8 Hr Delay  Result Date: 05/29/2017 CLINICAL DATA:  Small-bowel obstruction protocol.  8 hour delay. EXAM: PORTABLE ABDOMEN - 1 VIEW COMPARISON:  None. FINDINGS: There is distended small bowel throughout the abdomen without significant change from the prior exam. There is no visualized oral contrast. IMPRESSION: No change in the appearance of the partial small bowel obstruction. Electronically Signed   By: Amie Portland M.D.   On: 05/29/2017 21:05    Anti-infectives: Anti-infectives (From admission, onward)    Start     Dose/Rate Route Frequency Ordered Stop   05/31/17 0600  ciprofloxacin (CIPRO) IVPB 400 mg     400 mg 200 mL/hr over 60 Minutes Intravenous Every 12 hours 05/31/17 0543        Assessment/Plan: s/p Procedure(s): EXPLORATORY LAPAROTOMY   A-fib with RVR - on diltiazem Remote MI - holding plavix (last dose 3/23) CKD-creatinine improving HTN  SBO versus enteritis - no prior h/o abdominal surgery - CT suggest transition zone therefore mechanical problem. - XR this AM shows persistent distal small bowel obstruction - passing some flatus, no BM - WBCdown, afebrile  -Strongly suspect mechanical SBO given persistence of radiographic findings and physical exam. -Differential diagnosis includes internal hernia, tumor, primary adhesions given the absence of prior abdominal surgery- -I have advised and undergo laparotomy and treatment of his SBO today.  I told him that most likely he had adhesions but could not rule out tumor.  I told him there was a small possibility that this was an ileus masquerading as SBO and the that the laparotomy may be negative.  I discussed the indications, details, techniques, and numerous risk of the surgery with him.  He is aware the risk of bleeding, infection, negative diagnosis, wound healing problems such as hernia or dehiscence, cardiac pulmonary and thromboembolic problems.  He understands all of these issues.  All his questions are answered.  He wants to have this done and would like to go ahead today.  ID - none FEN - IVF, NPO/NGT VTE - SCDs, heparin Foley - in place  Plan - laparotomy today..     LOS: 6 days    Ernestene Mention 05/31/2017

## 2017-05-31 NOTE — Op Note (Signed)
Patient Name:           Hayden Harmon   Date of Surgery:        05/31/2017  Pre op Diagnosis:      Small bowel obstruction  Post op Diagnosis:    Mechanical small bowel obstruction secondary to internal adhesions  Procedure:                 Exporter laparotomy, lysis of adhesions  Surgeon:                     Angelia Mould. Derrell Lolling, M.D., FACS  Assistant:                      OR staff  Operative Indications:   This is a 77 year old man who was admitted on 05/25/2017 for atrial fibrillation with RVR, AKI,hyponatremia, hypertension. The general surgery service was consulted on 05/28/2017 and was seen by Dr. Marca Ancona.  He was evaluated for nausea vomiting abdominal distention and question of small bowel obstruction versus ileitis.Plavix was discontinued.  Nasogastric tube was inserted.  Small bowel protocol showed no contrast and the abdomen.  He has remained nontender but abdomen has remained somewhat distended and CT scan shows a transition zone in the small bowel centrally behind the umbilicus.  Plain films continue to show a high-grade distal SBO and he is brought to the operating room for laparotomy  Operative Findings:       The patient had a clear-cut mechanical small bowel obstruction secondary to benign adhesions.  The cause of these adhesions was not clear.  The small bowel was clearly dilated proximal to this and collapsed distally.  After taking down the adhesive bands and I lysed a few more adhesions in the area and ran the small bowel from the ligament of Treitz to the ileocecal valve twice and found no other pathology.  The appendix and cecum felt normal.  The colon was relatively soft.There is no ascites.  There was no stricture.  I could milk the small bowel content Pro grade from above all the way to the ileocecal valve.  No palpable mass.  Procedure in Detail:          Following the induction of general endotracheal anesthesia the patient's abdomen was prepped and draped in a sterile  fashion.  Prophylactic antibiotics were given.  Surgical timeout was performed.  Midline laparotomy incision was made just above the umbilicus.  The abdomen was entered and explored with findings as described above.  I eviscerated   the dilated small bowel and traced this distally until I found the adhesive band which I divided.  There were couple of other bands causing some mechanical tethering I took all of those down.  I then examined the small bowel from ligament of Treitz to the ileocecal valve twice.  I milked the small bowel content all the way to the ileocecal valve.  I found no strictures or tumor.  There is no internal hernia.  The small bowel and omentum were returned to their anatomic position. The NG tube was palpated and positioned. The midline fascia was closed with a running suture of #1, double-stranded PDS and the skin closed with skin staples.  Honeycomb bandage was placed and the patient taken to PACU in stable condition.  EBL 25 mL.  Counts correct.  Complications none.     Angelia Mould. Derrell Lolling, M.D., FACS General and Minimally Invasive Surgery Breast and Colorectal Surgery  05/31/2017 11:45 AM

## 2017-06-01 ENCOUNTER — Encounter (HOSPITAL_COMMUNITY): Payer: Self-pay | Admitting: General Surgery

## 2017-06-01 DIAGNOSIS — B965 Pseudomonas (aeruginosa) (mallei) (pseudomallei) as the cause of diseases classified elsewhere: Secondary | ICD-10-CM

## 2017-06-01 LAB — BASIC METABOLIC PANEL
Anion gap: 10 (ref 5–15)
BUN: 19 mg/dL (ref 6–20)
CO2: 20 mmol/L — ABNORMAL LOW (ref 22–32)
Calcium: 8 mg/dL — ABNORMAL LOW (ref 8.9–10.3)
Chloride: 111 mmol/L (ref 101–111)
Creatinine, Ser: 1.38 mg/dL — ABNORMAL HIGH (ref 0.61–1.24)
GFR calc Af Amer: 55 mL/min — ABNORMAL LOW (ref >60)
GFR calc non Af Amer: 47 mL/min — ABNORMAL LOW (ref >60)
Glucose, Bld: 125 mg/dL — ABNORMAL HIGH (ref 65–99)
Potassium: 5.1 mmol/L (ref 3.5–5.1)
Sodium: 141 mmol/L (ref 135–145)

## 2017-06-01 LAB — CBC
HCT: 37.7 % — ABNORMAL LOW (ref 39.0–52.0)
Hemoglobin: 11.5 g/dL — ABNORMAL LOW (ref 13.0–17.0)
MCH: 19.1 pg — ABNORMAL LOW (ref 26.0–34.0)
MCHC: 30.5 g/dL (ref 30.0–36.0)
MCV: 62.5 fL — ABNORMAL LOW (ref 78.0–100.0)
Platelets: 294 10*3/uL (ref 150–400)
RBC: 6.03 MIL/uL — ABNORMAL HIGH (ref 4.22–5.81)
RDW: 22.8 % — ABNORMAL HIGH (ref 11.5–15.5)
WBC: 14.7 10*3/uL — ABNORMAL HIGH (ref 4.0–10.5)

## 2017-06-01 LAB — GLUCOSE, CAPILLARY: Glucose-Capillary: 140 mg/dL — ABNORMAL HIGH (ref 65–99)

## 2017-06-01 MED ORDER — ACETAMINOPHEN 10 MG/ML IV SOLN
1000.0000 mg | Freq: Four times a day (QID) | INTRAVENOUS | Status: AC
  Administered 2017-06-01 – 2017-06-02 (×4): 1000 mg via INTRAVENOUS
  Filled 2017-06-01 (×5): qty 100

## 2017-06-01 MED ORDER — PIPERACILLIN-TAZOBACTAM 3.375 G IVPB
3.3750 g | Freq: Three times a day (TID) | INTRAVENOUS | Status: DC
  Administered 2017-06-01 – 2017-06-02 (×2): 3.375 g via INTRAVENOUS
  Filled 2017-06-01 (×3): qty 50

## 2017-06-01 NOTE — Anesthesia Postprocedure Evaluation (Signed)
Anesthesia Post Note  Patient: Alfredia ClientJohn N Zwahlen  Procedure(s) Performed: EXPLORATORY LAPAROTOMY, LYSIS OF ADHESIONS (N/A Abdomen)     Patient location during evaluation: PACU Anesthesia Type: General Level of consciousness: awake and alert Pain management: pain level controlled Vital Signs Assessment: post-procedure vital signs reviewed and stable Respiratory status: spontaneous breathing, nonlabored ventilation, respiratory function stable and patient connected to nasal cannula oxygen Cardiovascular status: blood pressure returned to baseline and stable Postop Assessment: no apparent nausea or vomiting Anesthetic complications: no    Last Vitals:  Vitals:   05/31/17 2328 06/01/17 0345  BP: (!) 137/96   Pulse: 83 95  Resp:    Temp: 37.7 C 36.8 C  SpO2:  99%    Last Pain:  Vitals:   06/01/17 0345  TempSrc: Oral  PainSc:                  Cecile HearingStephen Edward Turk

## 2017-06-01 NOTE — Progress Notes (Signed)
[facility] Progress Note  1 Day Post-Op  Subjective: CC-  No complaints this morning. Denies any abdominal pain. Denies n/v. States that he is passing some flatus, no BM.  Objective: Vital signs in last 24 hours: Temp:  [97.7 F (36.5 C)-99.9 F (37.7 C)] 98.5 F (36.9 C) (03/28 0718) Pulse Rate:  [64-129] 86 (03/28 0718) Resp:  [16-25] 18 (03/28 0718) BP: (117-156)/(80-96) 123/91 (03/28 0718) SpO2:  [93 %-100 %] 100 % (03/28 0718) Last BM Date: 05/25/17  Intake/Output from previous day: 03/27 0701 - 03/28 0700 In: 4070.1 [P.O.:40; I.V.:3025.1; IV Piggyback:1005] Out: 1580 [Urine:1225; Emesis/NG output:350; Blood:5] Intake/Output this shift: No intake/output data recorded.  PE: Gen: Alert, NAD HEENT: EOM's intact, pupils equal and round Card:Irregularly irregular Pulm: CTAB, no W/R/R, effort normal Abd: Soft, distended, nontender, hypoactive BS, midline incision with honeycomb dressing in place dried blood noted on dressing WUJ:WJXBJYExt:Calves soft and nontender Psych: A&Ox3  Skin: no rashes noted, warm and dry  Lab Results:  Recent Labs    05/31/17 0414 06/01/17 0236  WBC 6.8 14.7*  HGB 11.2* 11.5*  HCT 38.4* 37.7*  PLT 344 294   BMET Recent Labs    05/31/17 0414 06/01/17 0236  NA 146* 141  K 3.8 5.1  CL 114* 111  CO2 20* 20*  GLUCOSE 94 125*  BUN 17 19  CREATININE 1.24 1.38*  CALCIUM 8.3* 8.0*   PT/INR No results for input(s): LABPROT, INR in the last 72 hours. CMP     Component Value Date/Time   NA 141 06/01/2017 0236   NA 144 04/10/2017 1510   K 5.1 06/01/2017 0236   CL 111 06/01/2017 0236   CO2 20 (L) 06/01/2017 0236   GLUCOSE 125 (H) 06/01/2017 0236   BUN 19 06/01/2017 0236   BUN 13 04/10/2017 1510   CREATININE 1.38 (H) 06/01/2017 0236   CALCIUM 8.0 (L) 06/01/2017 0236   PROT 6.1 (L) 05/29/2017 0329   PROT 7.5 04/10/2017 1510   ALBUMIN 3.0 (L) 05/29/2017 0329   ALBUMIN 4.3 04/10/2017 1510   AST 16 05/29/2017 0329   ALT 10 (L) 05/29/2017 0329   ALKPHOS 57 05/29/2017 0329   BILITOT 1.2 05/29/2017 0329   BILITOT 0.4 04/10/2017 1510   GFRNONAA 47 (L) 06/01/2017 0236   GFRAA 55 (L) 06/01/2017 0236   Lipase     Component Value Date/Time   LIPASE 43 05/25/2017 1609       Studies/Results: Dg Abd Portable 1v  Result Date: 05/31/2017 CLINICAL DATA:  Small bowel obstruction EXAM: PORTABLE ABDOMEN - 1 VIEW COMPARISON:  05/30/2017 FINDINGS: NG tube remains in the stomach. Continued significant dilatation of small bowel loops compatible with high-grade small bowel obstruction, unchanged. No organomegaly or free air visualized. IMPRESSION: Continued high-grade small bowel obstruction pattern, unchanged. Electronically Signed   By: Charlett NoseKevin  Dover M.D.   On: 05/31/2017 09:49   Dg Abd Portable 1v  Result Date: 05/30/2017 CLINICAL DATA:  NG tube placement. EXAM: PORTABLE ABDOMEN - 1 VIEW COMPARISON:  05/30/2017. FINDINGS: NG tube has been slightly advanced, its tip and side hole within the stomach. Severely dilated loops of small bowel again noted. Small bowel is dilated up to 5.0 cm. No free air identified. Degenerative change thoracic spine. IMPRESSION: NG tube has been slightly advanced, its tip in the side hole within the stomach. 2.  Persistent severely dilated loops of small bowel. Electronically Signed   By: Maisie Fushomas  Register   On: 05/30/2017 15:21    Anti-infectives: Anti-infectives (From  admission, onward)   Start     Dose/Rate Route Frequency Ordered Stop   05/31/17 1002  cefoTEtan in Dextrose 5% (CEFOTAN) 2-2.08 GM-%(50ML) IVPB  Status:  Discontinued    Note to Pharmacy:  Aquilla Hacker   : cabinet override      05/31/17 1002 05/31/17 1225   05/31/17 1000  cefoTEtan (CEFOTAN) 2 g in sodium chloride 0.9 % 100 mL IVPB     2 g 200 mL/hr over 30 Minutes Intravenous On call to O.R. 05/31/17 1610 05/31/17 1258   05/31/17 0600  ciprofloxacin (CIPRO) IVPB 400 mg     400 mg 200 mL/hr over 60 Minutes Intravenous  Every 12 hours 05/31/17 0543         Assessment/Plan A-fib with RVR- on diltiazem Remote MI- holding plavix (last dose 3/23) CKD-creatinine 1.38, continue IVF HTN UTI - started on cipro 3/27  SBO 2/2 internal adhesions S/p Exporatory laparotomy, lysis of adhesions 3/27 Dr. Derrell Lolling - POD 1 - NG tube with 350cc/24 hour, passing flatus no BM  ID - cipro 3/27>>, cefotetan x1 3/27 FEN - IVF, NPO/NGT VTE - heparin Foley - d/c today Follow up - staples, In[place]  Plan - Continue NPO/NGT until return in bowel function. Ambulate patient today, continue PT. D/c foley catheter. Continue IV tylenol. Labs in AM. Patient does not need further antibiotics from surgical standpoint.   LOS: 7 days    Franne Forts , [facility] 06/01/2017, 7:48 AM Pager: [phone] Consults: [phone] Mon-Fri 7:00 am-4:30 pm Sat-Sun 7:00 am-11:30 am

## 2017-06-01 NOTE — Progress Notes (Signed)
Noticed no drainage was being suctioned through the NG tube.  Attempted to flush tube with warm water and a little bit of soda, but it was hard to flush through. When reattached to suction canister there was still no drainage coming out.  Dr. Frances FurbishWinfrey was informed and ordered to replace the NG tube.  After removing the old NG tube, a new 12 Fr. Tube was placed through the left nares. Placement confirmed with auscultation.  New tube hooked up to suction canister.  Will continue to monitor.   Harriet Massonavidson, Mckenzie Bove E, RN

## 2017-06-01 NOTE — Progress Notes (Signed)
Pharmacy Antibiotic Note  Hayden ClientJohn N Harmon is a 77 y.o. male admitted on 05/25/2017 with pseudomonas UTI (susceptibilities pending).  Pharmacy has been consulted for Zosyn dosing. Previously on Cipro, last dose this AM at 0608. Afebrile, WBC up to 14.7, SCr stable 1.38.  Plan: Zosyn 3.375g IV q8h (4h infusion) to start when next dose of Cipro due Monitor clinical progress, c/s, renal function F/u de-escalation plan/LOT Rx will s/o consult  Height: 5' 10.98" (180.3 cm) Weight: 191 lb 5.8 oz (86.8 kg) IBW/kg (Calculated) : 75.26  Temp (24hrs), Avg:98.3 F (36.8 C), Min:97.7 F (36.5 C), Max:99.9 F (37.7 C)  Recent Labs  Lab 05/27/17 0415 05/28/17 0751 05/29/17 0329 05/30/17 0333 05/31/17 0414 06/01/17 0236  WBC 8.3  --  16.1* 12.6* 6.8 14.7*  CREATININE 1.66* 1.50* 1.65* 1.38* 1.24 1.38*    Estimated Creatinine Clearance: 47 mL/min (A) (by C-G formula based on SCr of 1.38 mg/dL (H)).    No Known Allergies  Hayden BertinHaley Juliany Harmon, PharmD, BCPS Clinical Pharmacist Clinical phone for 06/01/2017 until 3:30pm: [phone] If after 3:30pm, please call main pharmacy at: [phone] 06/01/2017 11:06 AM

## 2017-06-01 NOTE — Progress Notes (Signed)
Clinical Social Worker following patient for support and discharge plan. CSW met patient and sister/brother at bedside. Patient stated he lives with brother and has a supportive family. Patient stated he does NOT want SNF and would rather go home. Patient is agreeable to discharge with Home Health once medically cleared by MD. Patient deferred home health decision to sister. Sister stated she has worked with [facility] in the past and would like them for patient. CSW to relay family decision to [facility]. Please consult CSW again if any new needs arise.   Rhea Pink, MSW,  [place]

## 2017-06-01 NOTE — Progress Notes (Signed)
   Subjective:  Patient seen and examined at bedside. NGT came out overnight, has been replaced.  Denies abdominal pain. Feels mildly distended. Passing some gas. Denies CP or SOB. No other acute complaints.  Objective:  Vital signs in last 24 hours: Vitals:   05/31/17 2040 05/31/17 2328 06/01/17 0345 06/01/17 0718  BP:  (!) 137/96  (!) 123/91  Pulse: 93 83 95 86  Resp:    18  Temp: 98.5 F (36.9 C) 99.9 F (37.7 C) 98.2 F (36.8 C) 98.5 F (36.9 C)  TempSrc: Oral Oral Oral Oral  SpO2:   99% 100%  Weight:      Height:       General: Chronically-ill appearing man resting in bed, NAD HEENT: +NGT in  place Cardiac: Tachycardic, IRIR Pulm: normal effort, breathing comfortably on RA Abd: Dressings overlying midline incision, mild swelling around dressings, distended, Minimal TTP, BS decreased Ext: extremities well perfused, no peripheral edema   Assessment/Plan:  Principal Problem:   Small bowel obstruction (HCC) Active Problems:   Acute renal failure (HCC)   Atrial fibrillation with RVR (HCC)   Enteritis   Hyponatremia   Prostate enlargement   Aortic atherosclerosis (HCC)  Mechanical small bowel obstruction secondary to internal adhesions Small Bowel Enteritis Patient POD1 ex-lap with lysis of adhesions without immediate complication. Clear-cut mechanical SBO secondary to benign adhesions appreciated and treated. Patient without pain, distention on exam, decreased bowel sounds. NGT still in place, gen surg suspects several days post operative ileus. Will defer to gen surg regarding clamping trials, etc.  -Gen Surgery on, appreciate their assistance -Encouraged ambulation, OOB into chair  -Resume Plavix at surgery teams discretion -Continue IV Protonix BID -NPO -IVF: LR 125 cc/hr  Atrial Fibrillation with RVR Rates improved, remains rate controlled on diltiazem infusion. Will work towards transitioning back to his home PO Cardizem once tolerating PO intake. -Cardiac  monitoring -Continue diltiazem infusion -Holding Cardizem 240 mg daily as NPO -SQ heparin   UTI Urine culture grew pseudomonas aeruginosa. Sensitivities pending.  Day 2 of antibiotics.  -D/c Foley -D/c Cipro -Zosyn per pharmacy  AKI Resolved.  AKI in setting of dehydration/poor PO intake. -AM BMETs -Follow UOP  Chronic Microcytic Anemia Minimal blood loss recorded during procedure. Hgb at baseline of 11.5  MCV 62.5. Iron studies consistent with iron deficiency anemia. Might benefit from parental iron at some point.  -Monitoring with daily CBC  HTN  Normotensive, BP 123/91. On lisinopril at home.  -Will continue to hold for NPO status   DVT ppx: [place] heparin Diet: NPO  Code Status: full code, confirmed on admission  Dispo: Anticipated discharge in approximately 3-4 day(s).   Toney RakesLacroce, Samantha J, MD 06/01/2017, 10:35 AM Pager: [phone]

## 2017-06-01 NOTE — Progress Notes (Signed)
  Date: 06/01/2017  Patient name: Alfredia ClientJohn N Sloop  Medical record number: [id number]  Date of birth: May 23, 1940   I have seen and evaluated this patient and I have discussed the plan of care with the house staff. Please see their note for complete details. I concur with their findings with the following additions/corrections:   POD 1 after exploratory laparoscopy for SBO with lysis of benign adhesions. Seems to be doing fairly well, with distended but nontender abdomen. NG tube still in place with bilious material in suction canister, not much bowel function yet, minimal bowel sounds on exam, consistent with expected post surgical ileus. Getting out of bed will likely help bowel function.  He has developed a UTI, and urine culture is growing Pseudomonas, sensitivities pending. We'll switch his antibiotics to piperacillin/tazobactam for better Pseudomonas coverage based on our systemwide antibiogram, we will tailor as we are able based on sensitivities. Foley is out, which will help us better treat this infection.  He remains in atrial fibrillation, but well rate controlled on a diltiazem drip, we will transition him back to oral meds when he is able to take PO.  AKI has resolved and he appears to be at his baseline creatinine. We will maintain his hydration with IV fluids until he is able to take PO.  Jessy OtoAlexander Raines, M.D., Ph.D. 06/01/2017, 3:54 PM

## 2017-06-02 ENCOUNTER — Inpatient Hospital Stay (HOSPITAL_COMMUNITY): Payer: Medicare Other

## 2017-06-02 LAB — URINE CULTURE
Culture: >=100000 — AB
Special Requests: NORMAL

## 2017-06-02 LAB — BASIC METABOLIC PANEL
ANION GAP: 10 (ref 5–15)
BUN: 15 mg/dL (ref 6–20)
CHLORIDE: 109 mmol/L (ref 101–111)
CO2: 20 mmol/L — AB (ref 22–32)
Calcium: 8.2 mg/dL — ABNORMAL LOW (ref 8.9–10.3)
Creatinine, Ser: 1.21 mg/dL (ref 0.61–1.24)
GFR calc Af Amer: >60 mL/min (ref >60)
GFR, EST NON AFRICAN AMERICAN: 56 mL/min — AB (ref >60)
GLUCOSE: 92 mg/dL (ref 65–99)
POTASSIUM: 4.1 mmol/L (ref 3.5–5.1)
Sodium: 139 mmol/L (ref 135–145)

## 2017-06-02 LAB — CBC
HEMATOCRIT: 39.8 % (ref 39.0–52.0)
HEMOGLOBIN: 12.1 g/dL — AB (ref 13.0–17.0)
MCH: 18.9 pg — AB (ref 26.0–34.0)
MCHC: 30.4 g/dL (ref 30.0–36.0)
MCV: 62.2 fL — ABNORMAL LOW (ref 78.0–100.0)
Platelets: 352 10*3/uL (ref 150–400)
RBC: 6.4 MIL/uL — ABNORMAL HIGH (ref 4.22–5.81)
RDW: 22.9 % — ABNORMAL HIGH (ref 11.5–15.5)
WBC: 13.2 10*3/uL — AB (ref 4.0–10.5)

## 2017-06-02 LAB — HIV ANTIBODY (ROUTINE TESTING W REFLEX): HIV Screen 4th Generation wRfx: NONREACTIVE

## 2017-06-02 LAB — GLUCOSE, CAPILLARY: Glucose-Capillary: 103 mg/dL — ABNORMAL HIGH (ref 65–99)

## 2017-06-02 MED ORDER — PIPERACILLIN-TAZOBACTAM 3.375 G IVPB
3.3750 g | Freq: Three times a day (TID) | INTRAVENOUS | Status: DC
  Administered 2017-06-02 – 2017-06-04 (×7): 3.375 g via INTRAVENOUS
  Filled 2017-06-02 (×9): qty 50

## 2017-06-02 MED ORDER — ACETAMINOPHEN 10 MG/ML IV SOLN
1000.0000 mg | Freq: Four times a day (QID) | INTRAVENOUS | Status: AC
  Administered 2017-06-02 – 2017-06-03 (×3): 1000 mg via INTRAVENOUS
  Filled 2017-06-02 (×4): qty 100

## 2017-06-02 MED ORDER — KETOROLAC TROMETHAMINE 30 MG/ML IJ SOLN: 30.0000 mg | Freq: Four times a day (QID) | INTRAMUSCULAR | Status: DC | PRN

## 2017-06-02 MED ORDER — KETOROLAC TROMETHAMINE 30 MG/ML IJ SOLN
30.0000 mg | Freq: Once | INTRAMUSCULAR | Status: AC
  Administered 2017-06-02: 30 mg via INTRAVENOUS
  Filled 2017-06-02: qty 1

## 2017-06-02 NOTE — Progress Notes (Signed)
ANTIBIOTIC CONSULT NOTE   Pharmacy Consult for Zosyn Indication: Pseudomonas UTI  No Known Allergies  Patient Measurements: Height: 5' 10.98" (180.3 cm) Weight: 198 lb 8 oz (90 kg) IBW/kg (Calculated) : 75.26 Adjusted Body Weight:   Vital Signs: Temp: 98.6 F (37 C) (03/29 0323) Temp Source: Oral (03/29 0323) BP: 123/93 (03/29 0400) Pulse Rate: 98 (03/29 0323) Intake/Output from previous day: 03/28 0701 - 03/29 0700 In: 4419.1 [P.O.:420; I.V.:3279.1; IV Piggyback:720] Out: 1500 [Urine:1250; Emesis/NG output:250] Intake/Output from this shift: No intake/output data recorded.  Labs: Recent Labs    05/31/17 0414 06/01/17 0236 06/02/17 0351  WBC 6.8 14.7* 13.2*  HGB 11.2* 11.5* 12.1*  PLT 344 294 352  CREATININE 1.24 1.38* 1.21   Estimated Creatinine Clearance: 53.6 mL/min (by C-G formula based on SCr of 1.21 mg/dL). No results for input(s): VANCOTROUGH, VANCOPEAK, VANCORANDOM, GENTTROUGH, GENTPEAK, GENTRANDOM, TOBRATROUGH, TOBRAPEAK, TOBRARND, AMIKACINPEAK, AMIKACINTROU, AMIKACIN in the last 72 hours.   Microbiology:   Medical History: Past Medical History:  Diagnosis Date  . A-fib (HCC)   . CKD (chronic kidney disease)   . Essential hypertension 04/10/2017  . Gout   . History of tobacco use   . Iron deficiency anemia   . MI, old 52015   Cath/stent recommended, refused PCI    Assessment:  ID: Abx D3 for pseudomonas UTI. WBC 13.2 down slightly. D/o dysuria 3/27 cipro>>3/28, 3/29>> 3/28 zosyn>>3/29  3/27 UC: pseudomonas (S-pending) 3/21 BCx: neg   Goal of Therapy:  Eradication of infection  Plan:  Continue zosyn 3.375g IV q 8 hrs.  Spoke with Dr. Minda MeoLacroce about Cipro this AM. Flouroquinolones should never be used in patient's where an alternative antibiotic choice is available due to significant risks associated with this class. Patient cannot currently take po's and remains hospitalized for GI issues so Zosyn would be the safest and an effective choice  to continue. If patient has not completed 7-10 days of therapy prior to discharge, then can order Cipro for Pseudomonas UTI at discharge.   Briony Parveen S. Merilynn Finlandobertson, PharmD, [facility] Clinical Staff Pharmacist Pager [phone]  Misty Stanleyobertson, [place] Nordmeyer Stillinger 06/02/2017,11:15 AM

## 2017-06-02 NOTE — Progress Notes (Addendum)
   Subjective:  Patient seen and examined at bedside. No acute events overnight. Patient states his abdominal pain is 8/10. The pain is localized around his incision site and is worse with movement. Still passing gas, no BM. Patient declined PT this AM 2/2 to pain. States he will be ready to work with them again this afternoon. Denies chest pain, shortness of breath, or dysuria.   Objective:  Vital signs in last 24 hours: Vitals:   06/01/17 2300 06/02/17 0000 06/02/17 0323 06/02/17 0400  BP: 121/84 (!) 147/91 (!) 149/98 (!) 123/93  Pulse: 83  98   Resp:  15 20 19   Temp: 98.2 F (36.8 C)  98.6 F (37 C)   TempSrc: Oral  Oral   SpO2:   98%   Weight:   198 lb 8 oz (90 kg)   Height:       General: Chronically-ill appearing man resting in bed, NAD HEENT: +NGT in  place Cardiac: normal rate, IRIR Pulm: normal effort, breathing comfortably on RA Abd: Dressings overlying midline incision, mild swelling around dressings, distended, Minimal TTP, BS decreased Ext: extremities well perfused, no peripheral edema   Assessment/Plan:  Principal Problem:   Small bowel obstruction (HCC) Active Problems:   Acute renal failure (HCC)   Atrial fibrillation with RVR (HCC)   Enteritis   Hyponatremia   Prostate enlargement   Aortic atherosclerosis (HCC)  Mechanical small bowel obstruction secondary to internal adhesions Small Bowel Enteritis Patient POD#2 ex-lap for SBO with lysis of adhesions without immediate complication. Patient afebrile and experieencing expected post operative pain. Abdominal exam with mild distention, minimal TTP, decreased bowel sounds consistent with post-op ileus.  -Gen Surgery on, appreciate their assistance -NGT with 250 cc of output/ 24 hrs, NGT in place until resumption of bowel function, will defer clamp trials to gen surg -Resume Plavix when patient able to tolerate PO -Continue IV Protonix BID -Pain control: Toradol 30 mg q6 PRN, please try toradol before  morphine -IVF: LR 125 cc/hr -PT/OT evaluated the patient and recommend SNF. Patient is declining SNF placement.  -Encouraged patient to get OOB into chair and work with PT, discussed with him the more he moves around the quicker his bowel function will return and the sooner he will be able to eat food - patient understands  Atrial Fibrillation with RVR Rates improved, remains rate controlled on diltiazem infusion. Will work towards transitioning back to his home PO Cardizem once tolerating PO intake. -Cardiac monitoring - rate controlled atrial fibrillation -Continue diltiazem infusion - reqiuring 15 mg/hr -Holding Cardizem 240 mg daily as NPO -SQ heparin   UTI Urine culture grew pseudomonas aeruginosa. Asymptomatic. Day 3 of antibiotics.  -Zosyn per pharmacy -Can likely transition to PO ciprofloxacin when tolerating PO and at discharge.  AKI Resolved.  AKI in setting of dehydration/poor PO intake. -AM BMETs - will monitor closely with addition of toradol -Follow UOP - reviewed I/Os adequate UOP s/p foley removal   HTN  Normotensive, BP 123/91. On lisinopril at home.  -Will continue to hold for NPO status   DVT ppx: [place] heparin Diet: NPO  Code Status: full code, confirmed on admission  Dispo: Anticipated discharge in approximately 3-4 day(s).   Toney RakesLacroce, Dimitry Holsworth J, MD 06/02/2017, 9:55 AM Pager: [phone]

## 2017-06-02 NOTE — Discharge Summary (Signed)
Name: Hayden Harmon MRN: [id number] DOB: Dec 22, 1940 77 y.o. PCP: Doneen Poisson, MD  Date of Admission: 05/25/2017  3:42 PM Date of Discharge: 06/05/2017 Attending Physician: Tyson Alias, *  Discharge Diagnosis: 1. Small Bowel Obstruction  Principal Problem:   Small bowel obstruction (HCC) Active Problems:   Iron deficiency anemia   Acute renal failure (HCC)   Atrial fibrillation with RVR (HCC)   Enteritis   Hyponatremia   Prostate enlargement   Aortic atherosclerosis (HCC)   Hypokalemia   Discharge Medications: Allergies as of 06/05/2017   No Known Allergies     Medication List    STOP taking these medications   clopidogrel 75 MG tablet Commonly known as:  PLAVIX     TAKE these medications   allopurinol 100 MG tablet Commonly known as:  ZYLOPRIM Take 2 tablets (200 mg total) by mouth daily.   apixaban 5 MG Tabs tablet Commonly known as:  ELIQUIS Take 1 tablet (5 mg total) by mouth 2 (two) times daily.   aspirin EC 81 MG tablet Take 81 mg by mouth daily.   diltiazem 240 MG 24 hr capsule Commonly known as:  DILT-XR Take 1 capsule (240 mg total) by mouth daily.   ferrous sulfate 325 (65 FE) MG tablet Take 1 tablet (325 mg total) by mouth daily.   lisinopril 2.5 MG tablet Commonly known as:  PRINIVIL,ZESTRIL Take 1 tablet (2.5 mg total) by mouth daily.   ondansetron 4 MG disintegrating tablet Commonly known as:  ZOFRAN ODT Take 1 tablet (4 mg total) by mouth every 8 (eight) hours as needed for nausea or vomiting.       Disposition and follow-up:   Hayden Harmon was discharged from [facility] in [place] condition.  At the hospital follow up visit please address:  Small bowel Enteritis, Small Bowel Obstruction - General surgery follow up for check up and staple removal - Refused SNF, ensure home health services are working for him  Atrial Fibrillation with RVR - Cardizem 240mg  daily  - Eliquis 5mg  Daily - He has  follow scheduled with A.Fib clinic 06/13/17 @ 3:00pm.  Urinary Tract Infection  - Treated with 6 days of antibiotics  AKI - Creatinine 4.4 on admission - Resolved with IVF resuscitation   Chronic Microcytic Anemia - CBC stable   Hypokalemia with Hypomagnesemia - K and Mg repleted - Recheck BMP to confirm resolution of Hypkalemia  Aortic atherosclerosis (HCC) - Noted incidentally on CT abdomen 3/24  2.  Labs / imaging needed at time of follow-up: BMP   3.  Pending labs/ test needing follow-up: None  Follow-up Appointments: Follow-up Information    Sur[facility], [facility] Follow up on 06/12/2017.   Specialty:  General Surgery Why:  Your appointment is at 10AM, be there 30 minutes early for check in.  Bring photo ID and insurnace information.  You will see the nurse to remove staples.   Contact information: [address] [place] [address] [phone]        Claud Kelp, MD Follow up on 06/22/2017.   Specialty:  General Surgery Why:  Your appointment is to see DR. Ingram at 9:45 AM. Be at the office 30 minutes early  for check in.  Bring photo ID and insurance information. Contact information: [address] [place] [address] [phone]           Hospital Course by problem list: Principal Problem:   Small bowel obstruction (  HCC) Active Problems:   Iron deficiency anemia   Acute renal failure (HCC)   Atrial fibrillation with RVR (HCC)   Enteritis   Hyponatremia   Prostate enlargement   Aortic atherosclerosis (HCC)   Hypokalemia   1. Small bowel Enteritis, Small Bowel Obstruction [name] [facility] the Internal Medicine Teaching Service for a several week history of vomiting and diarrhea, decreased PO intake, and signficant weight loss. Upon arrival, the patient was tachycardic, afebrile with low normal blood pressures. Labs were significant for leukocytosis, hyponatremia, and an  elevated creatinine of 4.44 (baseline of 1.3). His LFTs were within normal limits. CT scan of the abdomen and pelvis demonstrated small bowel enteritis. Several days following admission the patient developed large volume bilious emesis and repeat CT scan of the abdomen was consistent with high-grade acute SBO. Nasogastric tube was placed and surgery was consulted. He was treated with bowel rest and IV fluids. The patient continued to have high output from the NGT and made minimal improvement. He underwent exploratory laparotomy. Surgery revealed a mechanical SBO secondary to benign adhesions, which were lysed. No masses were palpated and their was no ischemic bowel reported. He developed a post-operative ileus requiring prologned NGT placement. This resolved and patient had tube pulled and diet advanced. Physical and occupational therapy recommended SNF, but the patient declined. Patient was discharged to with home health PT, OT, and Aid. He has follow up with general surgery for evaluation and staple removal.  2. Atrial Fibrillation with RVR Patient presented to [facility] in atrial fibrillation with rapid ventricular rate, likely secondary to acute illness, dehydration, and inability to take PO diltiazem. Patient required prolonged duration of IV diltiazem due to NPO status. His rates were well-controlled and he was transitioned to PO Cardizem 240mg  Daily. He has follow scheduled with A.Fib clinic 06/13/17 @ 3:00pm.  3. Urinary Tract Infection Notified by floor RN that patients urine was exceptionally foul, cloudy and the patient was complaining of dysuria. UA with +Nitrites, +Leukocytes, +Bacteria, 6-30 WBCs and TNTC RBCs.  Urine culture identified pseudomonas aeruginosa. The patient was started on zosyn and transitioned to oral Ciprofloxacin and was treated for a total 6 days.   4. Acute Kidney Injury secondary to hypovolemia Serum creatinine on admission was 4.44. Patient's baseline creatinine was around 1.3.  He was fluid resuscitated and his AKI resolved.   5. Hyponatremia Patient was hyponatremic with a serum sodium level of 128 secondary to hypovolemia. Resolved with fluid resuscitation.   6. Hypertension Blood pressure was normotensive during hospitalization.    Discharge Vitals:   BP (!) 133/91 (BP Location: Right Arm)   Pulse 92   Temp 97.9 F (36.6 C) (Oral)   Resp (!) 23   Ht 5' 10.98" (1.803 m)   Wt 192 lb (87.1 kg)   SpO2 97%   BMI 26.79 kg/m   Pertinent Labs, Studies, and Procedures:  BMP Latest Ref Rng & Units 06/05/2017 06/04/2017 06/03/2017  Glucose 65 - 99 mg/dL 161(W102(H) 80 96  BUN 6 - 20 mg/dL 7 9 12   Creatinine 0.61 - 1.24 mg/dL 9.601.00 4.540.95 0.981.12  BUN/Creat Ratio 10 - 24 - - -  Sodium 135 - 145 mmol/L 134(L) 136 137  Potassium 3.5 - 5.1 mmol/L 3.3(L) 4.0 3.1(L)  Chloride 101 - 111 mmol/L 104 108 105  CO2 22 - 32 mmol/L 20(L) 18(L) 21(L)  Calcium 8.9 - 10.3 mg/dL 8.0(L) 7.7(L) 7.7(L)   CBC Latest Ref Rng & Units  06/04/2017 06/02/2017 06/01/2017  WBC 4.0 - 10.5 K/uL 7.2 13.2(H) 14.7(H)  Hemoglobin 13.0 - 17.0 g/dL 10.8(L) 12.1(L) 11.5(L)  Hematocrit 39.0 - 52.0 % 36.8(L) 39.8 37.7(L)  Platelets 150 - 400 K/uL 326 352 294   IMPRESSION: 1. High-grade Acute Small Bowel Obstruction with an abrupt transition point in the central abdomen at the level of the umbilicus. See series 3 images 65 through 72. Associated severe gas and fluid distention of the stomach. 2. No abdominal free air or free fluid. 3. Urinary bladder now decompressed by Foley catheter. 4. Otherwise stable noncontrast CT appearance of the abdomen and Pelvis since 05/25/2017, including aortic atherosclerosis and 3.4 cm left iliac artery aneurysm.  Discharge Instructions: Discharge Instructions    Call MD for:  difficulty breathing, headache or visual disturbances   Complete by:  As directed    Call MD for:  extreme fatigue   Complete by:  As directed    Call MD for:  persistant dizziness or  light-headedness   Complete by:  As directed    Call MD for:  redness, tenderness, or signs of infection (pain, swelling, redness, odor or green/yellow discharge around incision site)   Complete by:  As directed    Call MD for:  severe uncontrolled pain   Complete by:  As directed    Call MD for:  temperature >100.4   Complete by:  As directed    Diet - low sodium heart healthy   Complete by:  As directed    Discharge instructions   Complete by:  As directed    Thank you for allowing [place] to care for you.  Your medications have changed as follows - Stop taking clopidogrel - Start taking Eliquis 5mg  Twice a day  Please follow up at your PCP office on 06/09/17 @ 1:45 pm  For your small bowl obstruction - Please follow up with surgery as scheduled by them for evaluation and staple removal  For your atrial fibrillation - Please follow up with Cardiology in the atrial fibrillation clinic on 06/13/17 @ 3:00pm   Increase activity slowly   Complete by:  As directed       Signed: Beola Cord, MD 06/05/2017, 2:26 PM   Pager: [phone]

## 2017-06-02 NOTE — Progress Notes (Signed)
PT Cancellation Note  Patient Details Name: Hayden Harmon ClientJohn N Goebel MRN: [id number] DOB: 04/28/1940   Cancelled Treatment:    Reason Eval/Treat Not Completed: Other (comment). Pt refusing to ambulate or get OOB for a multitude of reasons this morning, including fatigue, stomach soreness, and "it's too early for that stuff." Reports he is not likely to walk with PT later if we check back. Max encouragement and education on importance of frequent OOB mobilization. Extended time spent discussing PT/OT recommendation for SNF-level therapies based on current condition, but pt adamantly refusing. Recommend w/c for home due to decreased activity tolerance and generalized weakness.  Will plan to follow-up for PT treatment this afternoon as schedule permits.  Ina HomesJaclyn Tiffany Talarico, PT, DPT Acute Rehab Services  Pager: [phone]  Malachy ChamberJaclyn L Cheri Ayotte 06/02/2017, 8:37 AM

## 2017-06-02 NOTE — Progress Notes (Signed)
[facility] Progress Note  2 Days Post-Op  Subjective: CC- NG tube Patient asking when he will be able to eat regular food. He denies abdominal pain. Denies n/v. NG tube with low output. He reports passing flatus, no BM. Abdomen still distended with hypoactive BS.  He did get OOB yesterday to sit in the chair.  Objective: Vital signs in last 24 hours: Temp:  [97.6 F (36.4 C)-98.6 F (37 C)] 98.6 F (37 C) (03/29 0323) Pulse Rate:  [76-98] 98 (03/29 0323) Resp:  [15-20] 19 (03/29 0400) BP: (121-149)/(84-98) 123/93 (03/29 0400) SpO2:  [98 %-100 %] 98 % (03/29 0323) Weight:  [198 lb 8 oz (90 kg)] 198 lb 8 oz (90 kg) (03/29 0323) Last BM Date: 05/25/17  Intake/Output from previous day: 03/28 0701 - 03/29 0700 In: 4419.1 [P.O.:420; I.V.:3279.1; IV Piggyback:720] Out: 1500 [Urine:1250; Emesis/NG output:250] Intake/Output this shift: No intake/output data recorded.  PE: Gen: Alert, NAD HEENT: EOM's intact, pupils equal and round Card:Irregularly irregular Pulm: CTAB, no W/R/R, effort normal Abd: Soft, distended, nontender, hypoactive BS, midline incision with honeycomb dressing in place dried blood noted on dressing ZOX:WRUEAV soft and nontender Psych: A&Ox3  Skin: no rashes noted, warm and dry  Lab Results:  Recent Labs    06/01/17 0236 06/02/17 0351  WBC 14.7* 13.2*  HGB 11.5* 12.1*  HCT 37.7* 39.8  PLT 294 352   BMET Recent Labs    06/01/17 0236 06/02/17 0351  NA 141 139  K 5.1 4.1  CL 111 109  CO2 20* 20*  GLUCOSE 125* 92  BUN 19 15  CREATININE 1.38* 1.21  CALCIUM 8.0* 8.2*   PT/INR No results for input(s): LABPROT, INR in the last 72 hours. CMP     Component Value Date/Time   NA 139 06/02/2017 0351   NA 144 04/10/2017 1510   K 4.1 06/02/2017 0351   CL 109 06/02/2017 0351   CO2 20 (L) 06/02/2017 0351   GLUCOSE 92 06/02/2017 0351   BUN 15 06/02/2017 0351   BUN 13 04/10/2017 1510   CREATININE 1.21 06/02/2017 0351   CALCIUM  8.2 (L) 06/02/2017 0351   PROT 6.1 (L) 05/29/2017 0329   PROT 7.5 04/10/2017 1510   ALBUMIN 3.0 (L) 05/29/2017 0329   ALBUMIN 4.3 04/10/2017 1510   AST 16 05/29/2017 0329   ALT 10 (L) 05/29/2017 0329   ALKPHOS 57 05/29/2017 0329   BILITOT 1.2 05/29/2017 0329   BILITOT 0.4 04/10/2017 1510   GFRNONAA 56 (L) 06/02/2017 0351   GFRAA >60 06/02/2017 0351   Lipase     Component Value Date/Time   LIPASE 43 05/25/2017 1609       Studies/Results: No results found.  Anti-infectives: Anti-infectives (From admission, onward)   Start     Dose/Rate Route Frequency Ordered Stop   06/01/17 1800  piperacillin-tazobactam (ZOSYN) IVPB 3.375 g     3.375 g 12.5 mL/hr over 240 Minutes Intravenous Every 8 hours 06/01/17 1105     05/31/17 1002  cefoTEtan in Dextrose 5% (CEFOTAN) 2-2.08 GM-%(50ML) IVPB  Status:  Discontinued    Note to Pharmacy:  Aquilla Hacker   : cabinet override      05/31/17 1002 05/31/17 1225   05/31/17 1000  cefoTEtan (CEFOTAN) 2 g in sodium chloride 0.9 % 100 mL IVPB     2 g 200 mL/hr over 30 Minutes Intravenous On call to O.R. 05/31/17 4098 05/31/17 1258   05/31/17 0600  ciprofloxacin (CIPRO) IVPB 400 mg  Status:  Discontinued     400 mg 200 mL/hr over 60 Minutes Intravenous Every 12 hours 05/31/17 0543 06/01/17 1033       Assessment/Plan A-fib with RVR- on diltiazem Remote MI- holding plavix (last dose 3/23). Hg stable 12.1 from 11.5 CKD-creatinine improving 1.21 HTN UTI - started on cipro 3/27, Urine culture grew pseudomonas aeruginosa and patient was switched to zosyn 3/28  SBO 2/2 internal adhesions S/p Exporatory laparotomy, lysis of adhesions 3/27 Dr. Derrell LollingIngram - POD 2 - NG tube with 250cc/24 hour, passing flatus no BM - Patient does not need further antibiotics from surgical standpoint.  ID - zosyn 3/28>>, cipro 3/27>>3/28, cefotetan x1 3/27 FEN - IVF, NPO/NGT VTE - heparin Foley - d/c 3/28 Follow up - staples, Derrell LollingIngram  Plan - Continue NPO/NGT  until return in bowel function. Continue PT and encourage more OOB today. Continue IV tylenol. Hemoglobin stable, ok to restart plavix when tolerating PO's from surgical standpoint. Labs in AM.   LOS: 8 days    [name] , [facility] 06/02/2017, 7:58 AM Pager: [phone] Consults: [phone] Mon-Fri 7:00 am-4:30 pm Sat-Sun 7:00 am-11:30 am

## 2017-06-03 DIAGNOSIS — E876 Hypokalemia: Secondary | ICD-10-CM

## 2017-06-03 LAB — BASIC METABOLIC PANEL
Anion gap: 11 (ref 5–15)
BUN: 12 mg/dL (ref 6–20)
CO2: 21 mmol/L — ABNORMAL LOW (ref 22–32)
Calcium: 7.7 mg/dL — ABNORMAL LOW (ref 8.9–10.3)
Chloride: 105 mmol/L (ref 101–111)
Creatinine, Ser: 1.12 mg/dL (ref 0.61–1.24)
GFR calc Af Amer: >60 mL/min (ref >60)
GFR calc non Af Amer: >60 mL/min (ref >60)
Glucose, Bld: 96 mg/dL (ref 65–99)
Potassium: 3.1 mmol/L — ABNORMAL LOW (ref 3.5–5.1)
Sodium: 137 mmol/L (ref 135–145)

## 2017-06-03 LAB — GLUCOSE, CAPILLARY: Glucose-Capillary: 95 mg/dL (ref 65–99)

## 2017-06-03 MED ORDER — KCL-LACTATED RINGERS 20 MEQ/L IV SOLN
INTRAVENOUS | Status: DC
  Filled 2017-06-03: qty 1000

## 2017-06-03 MED ORDER — POTASSIUM CHLORIDE 2 MEQ/ML IV SOLN
INTRAVENOUS | Status: DC
  Administered 2017-06-03 – 2017-06-04 (×3): via INTRAVENOUS
  Filled 2017-06-03 (×4): qty 1000

## 2017-06-03 NOTE — Progress Notes (Signed)
3 Days Post-Op    CC:  SBO  Subjective: Has walked once, BS still slow some flatus, no BM.  Site looks fine.    Objective: Vital signs in last 24 hours: Temp:  [97.5 F (36.4 C)-98.2 F (36.8 C)] 97.5 F (36.4 C) (03/30 0834) Pulse Rate:  [73] 73 (03/30 0533) Resp:  [13-22] 22 (03/30 0834) BP: (127-161)/(88-96) 129/94 (03/30 0834) SpO2:  [95 %-100 %] 96 % (03/30 0834) Weight:  [89.2 kg (196 lb 9.6 oz)] 89.2 kg (196 lb 9.6 oz) (03/30 0642) Last BM Date: 05/25/17  60 PO 700 IV 1245 urine NG emesis 150 Afebrile, VSS BMP pending Last film yesterday:  NG in place   Intake/Output from previous day: 03/29 0701 - 03/30 0700 In: 855.5 [P.O.:60; I.V.:435.5; IV Piggyback:360] Out: 1395 [Urine:1245; Emesis/NG output:150] Intake/Output this shift: No intake/output data recorded.  General appearance: alert, cooperative and no distress Resp: clear to auscultation bilaterally and anterior GI: soft, hypoactive BS, incision is OK, + flatus  Lab Results:  Recent Labs    06/01/17 0236 06/02/17 0351  WBC 14.7* 13.2*  HGB 11.5* 12.1*  HCT 37.7* 39.8  PLT 294 352    BMET Recent Labs    06/01/17 0236 06/02/17 0351  NA 141 139  K 5.1 4.1  CL 111 109  CO2 20* 20*  GLUCOSE 125* 92  BUN 19 15  CREATININE 1.38* 1.21  CALCIUM 8.0* 8.2*   PT/INR No results for input(s): LABPROT, INR in the last 72 hours.  Recent Labs  Lab 05/28/17 0751 05/29/17 0329  AST 20 16  ALT 10* 10*  ALKPHOS 62 57  BILITOT 1.5* 1.2  PROT 7.0 6.1*  ALBUMIN 3.5 3.0*     Lipase     Component Value Date/Time   LIPASE 43 05/25/2017 1609     Medications: . Chlorhexidine Gluconate Cloth  6 each Topical Once  . heparin  5,000 Units Subcutaneous Q8H  . pantoprazole (PROTONIX) IV  40 mg Intravenous Q12H   . acetaminophen Stopped (06/03/17 0339)  . diltiazem (CARDIZEM) infusion 15 mg/hr (06/03/17 0843)  . lactated ringers Stopped (05/31/17 1339)  . lactated ringers 125 mL/hr at 06/02/17  0509  . methocarbamol (ROBAXIN)  IV Stopped (06/03/17 0542)  . piperacillin-tazobactam (ZOSYN)  IV 3.375 g (06/03/17 0546)   Assessment/Plan A-fib with RVR- on diltiazem Remote MI- holding plavix (last dose 3/23). Hg stable 12.1 from 11.5 CKD-creatinineimproving 1.21 HTN UTI - started on cipro 3/27, Urine culture grew pseudomonas aeruginosa and patient was switched to zosyn 3/28  SBO 2/2 internal adhesions S/pExporatorylaparotomy, lysis of adhesions 3/27 Dr. Derrell LollingIngram - POD 3 - NG tube with 250cc/24 hour, passing flatus no BM - Patient does not need further antibiotics from surgical standpoint.  ID -zosyn 3/28>>, cipro 3/27>>3/28, cefotetan x1 3/27 FEN -IVF, NPO/NGT VTE -heparin Foley -d/c 3/28 Follow up -staples, Derrell LollingIngram   Plan:  Clamping trials to NG, walk more, sips and chips.  Ambulate QID.  He has PT and OT consults.        LOS: 9 days    Mahalie Kanner 06/03/2017 [phone]

## 2017-06-03 NOTE — Progress Notes (Signed)
3 Days Post-Op   Subjective/Chief Complaint: Doing well + flatus Mobilizing    Objective: Vital signs in last 24 hours: Temp:  [97.5 F (36.4 C)-98.2 F (36.8 C)] 97.5 F (36.4 C) (03/30 0834) Pulse Rate:  [73] 73 (03/30 0533) Resp:  [13-22] 22 (03/30 0834) BP: (127-161)/(88-96) 129/94 (03/30 0834) SpO2:  [95 %-100 %] 96 % (03/30 0834) Weight:  [89.2 kg (196 lb 9.6 oz)] 89.2 kg (196 lb 9.6 oz) (03/30 0642) Last BM Date: 05/25/17  Intake/Output from previous day: 03/29 0701 - 03/30 0700 In: 855.5 [P.O.:60; I.V.:435.5; IV Piggyback:360] Out: 1395 [Urine:1245; Emesis/NG output:150] Intake/Output this shift: No intake/output data recorded.  General appearance: alert and cooperative GI: soft, nttp, hypoactive BS  Lab Results:  Recent Labs    06/01/17 0236 06/02/17 0351  WBC 14.7* 13.2*  HGB 11.5* 12.1*  HCT 37.7* 39.8  PLT 294 352   BMET Recent Labs    06/01/17 0236 06/02/17 0351  NA 141 139  K 5.1 4.1  CL 111 109  CO2 20* 20*  GLUCOSE 125* 92  BUN 19 15  CREATININE 1.38* 1.21  CALCIUM 8.0* 8.2*   PT/INR No results for input(s): LABPROT, INR in the last 72 hours. ABG No results for input(s): PHART, HCO3 in the last 72 hours.  Invalid input(s): PCO2, PO2  Studies/Results: Dg Abd Portable 1v  Result Date: 06/02/2017 CLINICAL DATA:  Nasogastric tube placement EXAM: PORTABLE ABDOMEN - 1 VIEW COMPARISON:  None. FINDINGS: Nasogastric tube projecting over the stomach. There is gaseous distention of small bowel and colon. There is no evidence of pneumoperitoneum, portal venous gas or pneumatosis. There are no pathologic calcifications along the expected course of the ureters. The osseous structures are unremarkable. IMPRESSION: Nasogastric tube with the tip projecting over the stomach. Electronically Signed   By: Elige KoHetal  Patel   On: 06/02/2017 18:46    Anti-infectives: Anti-infectives (From admission, onward)   Start     Dose/Rate Route Frequency Ordered Stop    06/02/17 1400  piperacillin-tazobactam (ZOSYN) IVPB 3.375 g     3.375 g 12.5 mL/hr over 240 Minutes Intravenous Every 8 hours 06/02/17 1153     06/01/17 1800  piperacillin-tazobactam (ZOSYN) IVPB 3.375 g  Status:  Discontinued     3.375 g 12.5 mL/hr over 240 Minutes Intravenous Every 8 hours 06/01/17 1105 06/02/17 1016   05/31/17 1002  cefoTEtan in Dextrose 5% (CEFOTAN) 2-2.08 GM-%(50ML) IVPB  Status:  Discontinued    Note to Pharmacy:  Aquilla HackerWalton, Susan   : cabinet override      05/31/17 1002 05/31/17 1225   05/31/17 1000  cefoTEtan (CEFOTAN) 2 g in sodium chloride 0.9 % 100 mL IVPB     2 g 200 mL/hr over 30 Minutes Intravenous On call to O.R. 05/31/17 0928 05/31/17 1258   05/31/17 0600  ciprofloxacin (CIPRO) IVPB 400 mg  Status:  Discontinued     400 mg 200 mL/hr over 60 Minutes Intravenous Every 12 hours 05/31/17 0543 06/01/17 1033      Assessment/Plan: s/p Procedure(s): EXPLORATORY LAPAROTOMY, LYSIS OF ADHESIONS (N/A) -clamp NGT x 6 hr and then DC if no n/v -If OK may start CLD tomorrow  LOS: 9 days    Hayden Ehlersamirez Jr., Hayden Harmon 06/03/2017

## 2017-06-03 NOTE — Progress Notes (Signed)
   Subjective:  Patient seen and examined. He is OOB into bedside chair. NGT came out again last night and was replaced. Patient is getting frustrated and wants the NGT removed and wants to eat. He states that regardless of what the surgeons say he wants it to be removed. He denies abdominal pain. Passing gas. No BM. Denies dysuria.   Objective:  Vital signs in last 24 hours: Vitals:   06/03/17 0000 06/03/17 0533 06/03/17 0642 06/03/17 0834  BP: (!) 161/89 127/88  (!) 129/94  Pulse:  73    Resp: 13 13  (!) 22  Temp: 98.2 F (36.8 C) (!) 97.5 F (36.4 C)  (!) 97.5 F (36.4 C)  TempSrc: Oral Oral  Oral  SpO2: 99% 95%  96%  Weight:   196 lb 9.6 oz (89.2 kg)   Height:       General: Chronically-ill appearing, NAD HEENT: +NGT in  place Cardiac: normal rate, IRIR Pulm: normal effort, breathing comfortably on RA Abd: Dressings overlying midline incision, mild swelling around dressings, distended, Minimal TTP, BS decreased Ext: extremities well perfused, no peripheral edema   Assessment/Plan:  Principal Problem:   Small bowel obstruction (HCC) Active Problems:   Acute renal failure (HCC)   Atrial fibrillation with RVR (HCC)   Enteritis   Hyponatremia   Prostate enlargement   Aortic atherosclerosis (HCC)  Mechanical small bowel obstruction secondary to internal adhesions Small Bowel Enteritis Patient POD#3 ex-lap for SBO with lysis of adhesions without immediate complication. Patient afebrile with benign abdominal exam with mild distention and decreased bowel sounds consistent with post-op ileus.  -Gen Surgery on, appreciate their assistance -NGT with 150 cc of output/ 24 hrs, Clamping trials per gen surg today -Resume Plavix when patient able to tolerate PO -Continue IV Protonix BID -Pain control: Toradol 30 mg q6 PRN -IVF: LR 125 cc/hr -PT/OT evaluated the patient and recommend SNF. Patient is declining SNF placement.  -Ambulate QID, PT/OT, OOB into chair  Atrial  Fibrillation with RVR Rates improved, remains rate controlled on diltiazem infusion. Will work towards transitioning back to his home PO Cardizem once tolerating PO intake. -Cardiac monitoring - rate controlled atrial fibrillation -Continue diltiazem infusion - reqiuring 15 mg/hr -Holding Cardizem 240 mg daily as NPO -SQ heparin   UTI Urine culture grew pseudomonas aeruginosa. Asymptomatic. Day 4 of antibiotics.  -Zosyn per pharmacy -Can likely transition to PO ciprofloxacin when tolerating PO and at discharge.  Hypokalemia K 3.1, adding K to LR infusion  -AM BMETs, checking Mg with AM labs  HTN  Normotensive, BP 129/94. On lisinopril at home.  -Will continue to hold for NPO status   DVT ppx: [place] heparin Diet: NPO, ice chips  Code Status: full code, confirmed on admission  Dispo: Anticipated discharge in approximately 3-4 day(s).   Toney RakesLacroce, Maleeka Sabatino J, MD 06/03/2017, 11:03 AM Pager: [phone]

## 2017-06-04 DIAGNOSIS — I700 Atherosclerosis of aorta: Secondary | ICD-10-CM

## 2017-06-04 DIAGNOSIS — I2510 Atherosclerotic heart disease of native coronary artery without angina pectoris: Secondary | ICD-10-CM

## 2017-06-04 DIAGNOSIS — K56609 Unspecified intestinal obstruction, unspecified as to partial versus complete obstruction: Secondary | ICD-10-CM

## 2017-06-04 DIAGNOSIS — I4891 Unspecified atrial fibrillation: Secondary | ICD-10-CM

## 2017-06-04 DIAGNOSIS — E876 Hypokalemia: Secondary | ICD-10-CM

## 2017-06-04 LAB — BASIC METABOLIC PANEL
Anion gap: 10 (ref 5–15)
BUN: 9 mg/dL (ref 6–20)
CO2: 18 mmol/L — ABNORMAL LOW (ref 22–32)
Calcium: 7.7 mg/dL — ABNORMAL LOW (ref 8.9–10.3)
Chloride: 108 mmol/L (ref 101–111)
Creatinine, Ser: 0.95 mg/dL (ref 0.61–1.24)
GFR calc Af Amer: >60 mL/min (ref >60)
GFR calc non Af Amer: >60 mL/min (ref >60)
Glucose, Bld: 80 mg/dL (ref 65–99)
Potassium: 4 mmol/L (ref 3.5–5.1)
Sodium: 136 mmol/L (ref 135–145)

## 2017-06-04 LAB — CBC
HCT: 36.8 % — ABNORMAL LOW (ref 39.0–52.0)
Hemoglobin: 10.8 g/dL — ABNORMAL LOW (ref 13.0–17.0)
MCH: 18.2 pg — ABNORMAL LOW (ref 26.0–34.0)
MCHC: 29.3 g/dL — ABNORMAL LOW (ref 30.0–36.0)
MCV: 62 fL — ABNORMAL LOW (ref 78.0–100.0)
Platelets: 326 10*3/uL (ref 150–400)
RBC: 5.94 MIL/uL — ABNORMAL HIGH (ref 4.22–5.81)
RDW: 22.3 % — ABNORMAL HIGH (ref 11.5–15.5)
WBC: 7.2 10*3/uL (ref 4.0–10.5)

## 2017-06-04 LAB — GLUCOSE, CAPILLARY: Glucose-Capillary: 99 mg/dL (ref 65–99)

## 2017-06-04 LAB — MAGNESIUM: Magnesium: 1.5 mg/dL — ABNORMAL LOW (ref 1.7–2.4)

## 2017-06-04 MED ORDER — ASPIRIN 81 MG PO CHEW
81.0000 mg | CHEWABLE_TABLET | Freq: Every day | ORAL | Status: DC
  Administered 2017-06-05: 81 mg via ORAL
  Filled 2017-06-04: qty 1

## 2017-06-04 MED ORDER — ACETAMINOPHEN 500 MG PO TABS: 1000.0000 mg | ORAL_TABLET | Freq: Four times a day (QID) | ORAL | Status: DC | PRN

## 2017-06-04 MED ORDER — DILTIAZEM HCL ER COATED BEADS 240 MG PO CP24
240.0000 mg | ORAL_CAPSULE | Freq: Every day | ORAL | Status: DC
  Administered 2017-06-04 – 2017-06-05 (×2): 240 mg via ORAL
  Filled 2017-06-04 (×4): qty 2
  Filled 2017-06-04: qty 1

## 2017-06-04 MED ORDER — APIXABAN 5 MG PO TABS
5.0000 mg | ORAL_TABLET | Freq: Two times a day (BID) | ORAL | Status: DC
  Administered 2017-06-04 – 2017-06-05 (×2): 5 mg via ORAL
  Filled 2017-06-04 (×2): qty 1

## 2017-06-04 MED ORDER — CIPROFLOXACIN HCL 500 MG PO TABS
500.0000 mg | ORAL_TABLET | Freq: Two times a day (BID) | ORAL | Status: AC
  Administered 2017-06-04 – 2017-06-05 (×2): 500 mg via ORAL
  Filled 2017-06-04: qty 1

## 2017-06-04 MED ORDER — MAGNESIUM SULFATE 2 GM/50ML IV SOLN
2.0000 g | Freq: Once | INTRAVENOUS | Status: AC
  Administered 2017-06-04: 2 g via INTRAVENOUS
  Filled 2017-06-04: qty 50

## 2017-06-04 NOTE — Consult Note (Signed)
CONSULTATION NOTE   Patient Name: Hayden Harmon Date of Encounter: 06/04/2017 Cardiologist: No primary care provider on file.  Chief Complaint   No complaints, asked to see for a-fib  Patient Profile   77 yo male, newly established care in [place], history of a-fib, but on aspirin and plavix, admitted for SBO, now asked to see for a-fib/anticoagulation managment  HPI   Hayden Harmon is a 77 y.o. male who is being seen today for the evaluation of afib at the request of Dr. Eppie Gibson. This is a pleasant 77 year old male who recently established care in the [place] area.  He was originally from [place] and is lived on [place] and most recently in [place].  He has a history of atrial fibrillation, and says that he was started on aspirin and Plavix specifically for this when he was in [place].  He denies any history of coronary artery disease, prior stenting or peripheral arterial disease, although it is documented that he has a history of old MI and cath apparently was performed however he refused PCI?  This is somewhat unusual -records indicate this was 31.  He does not recall being on warfarin or any of the direct oral anticoagulants.  He was recently seen in the ER and has been having issues with nausea, vomiting and diarrhea.  After discharge he was sent to the A. fib clinic and was promptly sent to the emergency department for admission by Hayden Palau, NP.  It does not sound like there was time to address anticoagulation issues or why he was on antiplatelets. His CHADSVASC score is at least 3 (Age >75 and HTN).  He underwent exploratory laparotomy and was found to have SBO with lysis of adhesions.  He reports improvement in his symptoms and pain and is now tolerating a diet.  He is currently in rate controlled atrial fibrillation on diltiazem with plans to convert to p.o. diltiazem.  PMHx   Past Medical History:  Diagnosis Date  . A-fib ([place])   . CKD (chronic kidney  disease)   . Essential hypertension 04/10/2017  . Gout   . History of tobacco use   . Iron deficiency anemia   . MI, old 40   Cath/stent recommended, refused PCI    Past Surgical History:  Procedure Laterality Date  . INGUINAL HERNIA REPAIR    . LAPAROTOMY N/A 05/31/2017   Procedure: EXPLORATORY LAPAROTOMY, LYSIS OF ADHESIONS;  Surgeon: Fanny Skates, MD;  Location: [facility];  Service: General;  Laterality: N/A;    FAMHx   History reviewed. No pertinent family history.  No history of A. fib in the family.  SOCHx    reports that he has never smoked. He has never used smokeless tobacco. He reports that he does not drink alcohol or use drugs.  Outpatient Medications   No current facility-administered medications on file prior to encounter.    Current Outpatient Medications on File Prior to Encounter  Medication Sig Dispense Refill  . allopurinol (ZYLOPRIM) 100 MG tablet Take 2 tablets (200 mg total) by mouth daily. 180 tablet 3  . aspirin EC 81 MG tablet Take 81 mg by mouth daily.    . clopidogrel (PLAVIX) 75 MG tablet Take 75 mg by mouth daily.    Marland Kitchen diltiazem (DILT-XR) 240 MG 24 hr capsule Take 1 capsule (240 mg total) by mouth daily. 90 capsule 3  . lisinopril (PRINIVIL,ZESTRIL) 2.5 MG tablet Take 1 tablet (2.5 mg total) by mouth daily. [place]  tablet 2  . ferrous sulfate 325 (65 FE) MG tablet Take 1 tablet (325 mg total) by mouth daily. (Patient not taking: Reported on 05/21/2017) 30 tablet 1  . ondansetron (ZOFRAN ODT) 4 MG disintegrating tablet Take 1 tablet (4 mg total) by mouth every 8 (eight) hours as needed for nausea or vomiting. (Patient not taking: Reported on 05/25/2017) 6 tablet 0    Inpatient Medications    Scheduled Meds: . Chlorhexidine Gluconate Cloth  6 each Topical Once  . heparin  5,000 Units Subcutaneous Q8H  . pantoprazole (PROTONIX) IV  40 mg Intravenous Q12H    Continuous Infusions: . diltiazem (CARDIZEM) infusion 15 mg/hr (06/04/17 1113)  . methocarbamol  (ROBAXIN)  IV Stopped (06/04/17 0714)  . piperacillin-tazobactam (ZOSYN)  IV Stopped (06/04/17 0945)    PRN Meds: acetaminophen, ondansetron **OR** ondansetron (ZOFRAN) IV, prochlorperazine, promethazine   ALLERGIES   No Known Allergies  ROS   Pertinent items noted in HPI and remainder of comprehensive ROS otherwise negative.  Vitals   Vitals:   06/03/17 2343 06/04/17 0340 06/04/17 0556 06/04/17 0809  BP: 129/82 128/73  127/76  Pulse: 88     Resp: '16 11  16  '$ Temp: 98.2 F (36.8 C) 98.1 F (36.7 C)  98.3 F (36.8 C)  TempSrc: Oral Oral  Oral  SpO2: 95% 95%  99%  Weight:   192 lb 14.4 oz (87.5 kg)   Height:        Intake/Output Summary (Last 24 hours) at 06/04/2017 1204 Last data filed at 06/04/2017 0341 Gross per 24 hour  Intake 1942.5 ml  Output 1600 ml  Net 342.5 ml   Filed Weights   06/02/17 0323 06/03/17 0642 06/04/17 0556  Weight: 198 lb 8 oz (90 kg) 196 lb 9.6 oz (89.2 kg) 192 lb 14.4 oz (87.5 kg)    Physical Exam   General appearance: alert and no distress Neck: no carotid bruit, no JVD and thyroid not enlarged, symmetric, no tenderness/mass/nodules Lungs: clear to auscultation bilaterally Heart: irregularly irregular rhythm Abdomen: Postsurgical, mildly tender to palpation Extremities: extremities normal, atraumatic, no cyanosis or edema Pulses: 2+ and symmetric Skin: Skin color, texture, turgor normal. No rashes or lesions Neurologic: Grossly normal Psych: Pleasant  Labs   Results for orders placed or performed during the hospital encounter of 05/25/17 (from the past 48 hour(s))  Glucose, capillary     Status: None   Collection Time: 06/03/17  6:08 AM  Result Value Ref Range   Glucose-Capillary 95 65 - 99 mg/dL  Basic metabolic panel     Status: Abnormal   Collection Time: 06/03/17  9:03 AM  Result Value Ref Range   Sodium 137 135 - 145 mmol/L   Potassium 3.1 (L) 3.5 - 5.1 mmol/L    Comment: DELTA CHECK NOTED   Chloride 105 101 - 111 mmol/L     CO2 21 (L) 22 - 32 mmol/L   Glucose, Bld 96 65 - 99 mg/dL   BUN 12 6 - 20 mg/dL   Creatinine, Ser 1.12 0.61 - 1.24 mg/dL   Calcium 7.7 (L) 8.9 - 10.3 mg/dL   GFR calc non Af Amer >60 >60 mL/min   GFR calc Af Amer >60 >60 mL/min    Comment: (NOTE) The eGFR has been calculated using the CKD EPI equation. This calculation has not been validated in all clinical situations. eGFR's persistently <60 mL/min signify possible Chronic Kidney Disease.    Anion gap 11 5 - 15    Comment: Performed  at [facility], [place] [address] [place], [place] [address]  Basic metabolic panel     Status: Abnormal   Collection Time: 06/04/17  3:05 AM  Result Value Ref Range   Sodium 136 135 - 145 mmol/L   Potassium 4.0 3.5 - 5.1 mmol/L   Chloride 108 101 - 111 mmol/L   CO2 18 (L) 22 - 32 mmol/L   Glucose, Bld 80 65 - 99 mg/dL   BUN 9 6 - 20 mg/dL   Creatinine, Ser 0.95 0.61 - 1.24 mg/dL   Calcium 7.7 (L) 8.9 - 10.3 mg/dL   GFR calc non Af Amer >60 >60 mL/min   GFR calc Af Amer >60 >60 mL/min    Comment: (NOTE) The eGFR has been calculated using the CKD EPI equation. This calculation has not been validated in all clinical situations. eGFR's persistently <60 mL/min signify possible Chronic Kidney Disease.    Anion gap 10 5 - 15    Comment: Performed at [facility] [address], [place], [place] [address]  Magnesium     Status: Abnormal   Collection Time: 06/04/17  3:05 AM  Result Value Ref Range   Magnesium 1.5 (L) 1.7 - 2.4 mg/dL    Comment: Performed at [facility] [address] [place], [place] [address]  CBC     Status: Abnormal   Collection Time: 06/04/17  3:05 AM  Result Value Ref Range   WBC 7.2 4.0 - 10.5 K/uL   RBC 5.94 (H) 4.22 - 5.81 MIL/uL   Hemoglobin 10.8 (L) 13.0 - 17.0 g/dL   HCT 36.8 (L) 39.0 - 52.0 %   MCV 62.0 (L) 78.0 - 100.0 fL   MCH 18.2 (L) 26.0 - 34.0 pg   MCHC 29.3 (L) 30.0 - 36.0 g/dL   RDW 22.3 (H) 11.5 - 15.5 %   Platelets 326 150 - 400  K/uL    Comment: Performed at [facility], [place] [address] [place], [place] [address]  Glucose, capillary     Status: None   Collection Time: 06/04/17  6:24 AM  Result Value Ref Range   Glucose-Capillary 99 65 - 99 mg/dL    ECG   N/A  Telemetry   A-fib with CVR - Personally Reviewed  Radiology   Dg Abd Portable 1v  Result Date: 06/02/2017 CLINICAL DATA:  Nasogastric tube placement EXAM: PORTABLE ABDOMEN - 1 VIEW COMPARISON:  None. FINDINGS: Nasogastric tube projecting over the stomach. There is gaseous distention of small bowel and colon. There is no evidence of pneumoperitoneum, portal venous gas or pneumatosis. There are no pathologic calcifications along the expected course of the ureters. The osseous structures are unremarkable. IMPRESSION: Nasogastric tube with the tip projecting over the stomach. Electronically Signed   By: Kathreen Devoid   On: 06/02/2017 18:46    Cardiac Studies   N/A  Impression   1. Principal Problem: 2.   Small bowel obstruction ([place]) 3. Active Problems: 4.   Iron deficiency anemia 5.   Acute renal failure ([place]) 6.   Atrial fibrillation with RVR ([place]) 7.   Enteritis 8.   Hyponatremia 9.   Prostate enlargement 10.   Aortic atherosclerosis ([place]) 11.   Hypokalemia 12.   Recommendation   1. Hayden Harmon presented with small bowel obstruction and has a history of A. fib with variable ventricular response.  He was in RVR treated with diltiazem.  He will transition to p.o. diltiazem today.  His  medical history is confusing as he denies any history of heart disease, however his chart indicates in 2015 that he was found to have coronary disease, possibly by cath but apparently refused PCI?  This is very unusual since it is typically performed simultaneously unless he had a separate diagnostic catheterization and was referred for intervention.  Based on this, it may explain why he was on aspirin and Plavix.  His CHADSVASC score is at least 3, therefore  additional anticoagulation is warranted.  I would recommend starting Eliquis 5 mg twice daily.  In addition he should remain on aspirin 81 mg daily.  I would discontinue clopidogrel.  He should follow-up in the A. fib clinic with Hayden Palau, NP for his A. fib.  I am happy to see him in the future for coronary artery disease follow-up.  Thanks for the consultation.  Time Spent Directly with Patient:  I have spent a total of 45 minutes with the patient reviewing hospital notes, telemetry, EKGs, labs and examining the patient as well as establishing an assessment and plan that was discussed personally with the patient. > 50% of time was spent in direct patient care.  Length of Stay:  LOS: 10 days   Pixie Casino, MD, [facility], [place] Director of the Advanced Lipid Disorders &  Cardiovascular Risk Reduction Clinic Diplomate of the American Board of Clinical Lipidology Attending Cardiologist  Direct Dial: [phone]  Fax: [phone]  Website:  [email or web address] 06/04/2017, 12:04 PM

## 2017-06-04 NOTE — Progress Notes (Signed)
   Subjective: Hayden Harmon is feeling well today, all of his symptoms from the time of admission have resolved.  He is having no dysuria and urinating well.  He has not had a bowel movement yet but tolerated NG tube clamping yesterday and has had the tube removed.  He has been drinking clear liquids and tolerating this well.  Feels that he may be ready for discharge home with home health soon.  Objective:  Vital signs in last 24 hours: Vitals:   06/03/17 2343 06/04/17 0340 06/04/17 0556 06/04/17 0809  BP: 129/82 128/73  127/76  Pulse: 88     Resp: 16 11  16   Temp: 98.2 F (36.8 C) 98.1 F (36.7 C)  98.3 F (36.8 C)  TempSrc: Oral Oral  Oral  SpO2: 95% 95%  99%  Weight:   192 lb 14.4 oz (87.5 kg)   Height:       General: well appearing, no acute distress  Cardiac: regular rate irregular rhythm, no peripheral edema, no calf tenderness Pulmonary: No respiratory distress, lungs clear to auscultation Remedies: Large tophus over the right knee  Assessment/Plan:  This is a 77 year old man with history of HTN, gout, afib ( not on anticoagulation ) who presented with vomiting and diarrhea and was found to have gastroenteritis and small bowl obstruction, pseudomonal UTI, and in atrial fibrillation with RVR.     Small bowel obstruction (HCC) secondary to internal adhesions    Enteritis - s/p exploratory laparotomy for SBO with lysis of intraabdominal adhesions on 3/27 - general surgery following, appreciate their recommendations  - NGT clamping trial yesterday, passed trial and the NG tube was removed  - on IV protonix BID  - D/c IV toradol 30 mg q6h PRN as he is not using this, transition to tylenol  - Diet advanced to clear liquids today, he is tolerating this well will discontinue the LR rate and transition to PO meds  - no BM, passing flatus, ambulate QID, PT and OT     Atrial fibrillation with RVR (HCC) - rate 60-70s on dilt gtt 15 mg/hr, transition to PO cardizem 240 mg qd now that he  is tolerating PO  - sub cutaneous heparin, resume plavix when able to tolerate PO   Pseudomonas UTI  - on zosyn started 3/28 day 5, transition to cipro now that he is tolerating PO  - s/p cipro 3/27> 3/28 - pharmacy following, appreciate their recommendations   Hypokalemia  - K 4 today improved from 3.1 yesterday , Mag 1.5 - Repleat Mag  - stop potassium added to LR today  - recheck BMP and Mag tomorrow   Hypertension  - BP controlled 127/76 on dilt gtt - home med is lisinopril and cardizem     Acute renal failure (HCC) - resolved     Hyponatremia - resolved     Aortic atherosclerosis (HCC) - noted incidentally on CT abdomen 3/24 - will need continued monitoring   Anemia of iron deficiency  - TIBC 456 2/25, ferritin 16  -Hgb 10.8  - restart home PO iron when tolerating PO    Dispo:  - PT/OT recommending SNF placement for decreased activity tolerance and generalized weakness, patient declining SNF placement  - [facility] consult placed   Dispo: Anticipated discharge in approximately 1 day(s).   Eulah PontBlum, Reshad Saab, MD 06/04/2017, 8:15 AM Pager: [phone]

## 2017-06-04 NOTE — Progress Notes (Signed)
4 Days Post-Op   Subjective/Chief Complaint: Doing well  No n/v with CLD + flatus   Objective: Vital signs in last 24 hours: Temp:  [97.8 F (36.6 C)-98.3 F (36.8 C)] 98.3 F (36.8 C) (03/31 0809) Pulse Rate:  [88-103] 88 (03/30 2343) Resp:  [11-23] 16 (03/31 0809) BP: (127-135)/(73-100) 127/76 (03/31 0809) SpO2:  [95 %-99 %] 99 % (03/31 0809) Weight:  [87.5 kg (192 lb 14.4 oz)] 87.5 kg (192 lb 14.4 oz) (03/31 0556) Last BM Date: 05/25/17  Intake/Output from previous day: 03/30 0701 - 03/31 0700 In: 1942.5 [I.V.:1837.5; IV Piggyback:105] Out: 1600 [Urine:1400; Emesis/NG output:200] Intake/Output this shift: No intake/output data recorded.  General appearance: alert and cooperative GI: soft, non-tender; bowel sounds normal; no masses,  no organomegaly and inc c/d/i  Lab Results:  Recent Labs    06/02/17 0351 06/04/17 0305  WBC 13.2* 7.2  HGB 12.1* 10.8*  HCT 39.8 36.8*  PLT 352 326   BMET Recent Labs    06/03/17 0903 06/04/17 0305  NA 137 136  K 3.1* 4.0  CL 105 108  CO2 21* 18*  GLUCOSE 96 80  BUN 12 9  CREATININE 1.12 0.95  CALCIUM 7.7* 7.7*   PT/INR No results for input(s): LABPROT, INR in the last 72 hours. ABG No results for input(s): PHART, HCO3 in the last 72 hours.  Invalid input(s): PCO2, PO2  Studies/Results: Dg Abd Portable 1v  Result Date: 06/02/2017 CLINICAL DATA:  Nasogastric tube placement EXAM: PORTABLE ABDOMEN - 1 VIEW COMPARISON:  None. FINDINGS: Nasogastric tube projecting over the stomach. There is gaseous distention of small bowel and colon. There is no evidence of pneumoperitoneum, portal venous gas or pneumatosis. There are no pathologic calcifications along the expected course of the ureters. The osseous structures are unremarkable. IMPRESSION: Nasogastric tube with the tip projecting over the stomach. Electronically Signed   By: Elige KoHetal  Patel   On: 06/02/2017 18:46    Anti-infectives: Anti-infectives (From admission, onward)    Start     Dose/Rate Route Frequency Ordered Stop   06/02/17 1400  piperacillin-tazobactam (ZOSYN) IVPB 3.375 g     3.375 g 12.5 mL/hr over 240 Minutes Intravenous Every 8 hours 06/02/17 1153     06/01/17 1800  piperacillin-tazobactam (ZOSYN) IVPB 3.375 g  Status:  Discontinued     3.375 g 12.5 mL/hr over 240 Minutes Intravenous Every 8 hours 06/01/17 1105 06/02/17 1016   05/31/17 1002  cefoTEtan in Dextrose 5% (CEFOTAN) 2-2.08 GM-%(50ML) IVPB  Status:  Discontinued    Note to Pharmacy:  Aquilla HackerWalton, Susan   : cabinet override      05/31/17 1002 05/31/17 1225   05/31/17 1000  cefoTEtan (CEFOTAN) 2 g in sodium chloride 0.9 % 100 mL IVPB     2 g 200 mL/hr over 30 Minutes Intravenous On call to O.R. 05/31/17 0928 05/31/17 1258   05/31/17 0600  ciprofloxacin (CIPRO) IVPB 400 mg  Status:  Discontinued     400 mg 200 mL/hr over 60 Minutes Intravenous Every 12 hours 05/31/17 0543 06/01/17 1033      Assessment/Plan: s/p Procedure(s): EXPLORATORY LAPAROTOMY, LYSIS OF ADHESIONS (N/A) Advance diet to FLD Mobilize   LOS: 10 days    Hayden Ehlersamirez Jr., [facility] 06/04/2017

## 2017-06-05 ENCOUNTER — Telehealth: Payer: Self-pay

## 2017-06-05 DIAGNOSIS — M109 Gout, unspecified: Secondary | ICD-10-CM

## 2017-06-05 DIAGNOSIS — Z7901 Long term (current) use of anticoagulants: Secondary | ICD-10-CM

## 2017-06-05 LAB — BASIC METABOLIC PANEL
Anion gap: 10 (ref 5–15)
BUN: 7 mg/dL (ref 6–20)
CO2: 20 mmol/L — ABNORMAL LOW (ref 22–32)
Calcium: 8 mg/dL — ABNORMAL LOW (ref 8.9–10.3)
Chloride: 104 mmol/L (ref 101–111)
Creatinine, Ser: 1 mg/dL (ref 0.61–1.24)
GFR calc Af Amer: >60 mL/min (ref >60)
GFR calc non Af Amer: >60 mL/min (ref >60)
Glucose, Bld: 102 mg/dL — ABNORMAL HIGH (ref 65–99)
Potassium: 3.3 mmol/L — ABNORMAL LOW (ref 3.5–5.1)
Sodium: 134 mmol/L — ABNORMAL LOW (ref 135–145)

## 2017-06-05 LAB — MAGNESIUM: Magnesium: 1.8 mg/dL (ref 1.7–2.4)

## 2017-06-05 LAB — GLUCOSE, CAPILLARY: Glucose-Capillary: 99 mg/dL (ref 65–99)

## 2017-06-05 MED ORDER — APIXABAN 5 MG PO TABS: 5.0000 mg | ORAL_TABLET | Freq: Two times a day (BID) | ORAL | 2 refills | Status: DC

## 2017-06-05 MED ORDER — POTASSIUM CHLORIDE CRYS ER 20 MEQ PO TBCR
40.0000 meq | EXTENDED_RELEASE_TABLET | Freq: Once | ORAL | Status: AC
  Administered 2017-06-05: 40 meq via ORAL
  Filled 2017-06-05: qty 2

## 2017-06-05 NOTE — Progress Notes (Signed)
Patient with discharge orders to home. IVs removed (catheters intact) along with telemetry. Discharged orders given to sister with verbal readback of understanding. Brother assisted with getting patient ready as sister packed up belongings.  Patient appreciative of care.

## 2017-06-05 NOTE — Progress Notes (Addendum)
    Durable Medical Equipment  (From admission, onward)        Start     Ordered   06/05/17 1515  For home use only DME standard manual wheelchair with seat cushion  Once    Comments:  Patient suffers from deconditioning which impairs their ability to perform daily activities like toileting in the home.  A walker will not resolve  issue with performing activities of daily living. A wheelchair will allow patient to safely perform daily activities. Patient can safely propel the wheelchair in the home or has a caregiver who can provide assistance.  Accessories: elevating leg rests (ELRs), wheel locks, extensions and anti-tippers.   06/05/17 1514    Joycelyn RuaAimee Jaedin Regina, PTA pager [phone]

## 2017-06-05 NOTE — Telephone Encounter (Signed)
TOC per Dr Alinda MoneyMelvin, discharge 06/05/2017 appt 06/09/2017

## 2017-06-05 NOTE — Progress Notes (Signed)
   Subjective: [name] was seen resting in his bed. He states he feels back to normal today. He denies pain and has had a bowl movement. He has been tolerating diet advancement and ate some pancakes this morning. He was able to get up and walk to the bathroom and back. He states he feels ready to go home.  Objective:  Vital signs in last 24 hours: Vitals:   06/04/17 2035 06/05/17 0003 06/05/17 0400 06/05/17 0654  BP: (!) 132/93 (!) 121/94 (!) 123/97   Pulse:      Resp: 17 17 16 11   Temp: 98.3 F (36.8 C) 97.6 F (36.4 C) 98.4 F (36.9 C)   TempSrc: Oral Oral Oral   SpO2:      Weight:    192 lb (87.1 kg)  Height:       General: Well appearing, No acute distress  Cardiac: Regular rate, Irregular Rhythm, peripheral pulses intact Pulmonary: No respiratory distress, lungs clear to auscultation Abdomen: NABS, non-tender to palpation, well healing ex-lap scar with staples in place Extremities: No edema, no tenderness  Assessment/Plan:  This is a 77 year old man with history of HTN, Gout, A.fib (not previously on anticoagulation) who presented with vomiting and diarrhea and was found to have gastroenteritis and small bowl obstruction, pseudomonal UTI, and in atrial fibrillation with RVR.   Small bowel obstruction (HCC) secondary to internal adhesions  Enteritis - S/P exploratory laparotomy for SBO with lysis of intraabdominal adhesions on 3/27 - General surgery following, cleared for discharge, will follow up with them for post-op visit and staple removal - Now only requiring Tylenol PRN for pain - Diet advanced to soft today, he is tolerating this well - Has had BM - PT/OT recommended SNF, but patient has refused will discharge with home health  Atrial fibrillation with RVR [facility](HCC) - Cardiology following, recommend follow up in A-fib clinic - Rate 80s-90s on PO Diltiazem - CHADSVASC at least 3 - Cardizem 240mg  Daily - Eliquis 5mg  BID - Plavix discontinued  Hypokalemia  - K  3.3 today down from 4.0 yesterday, Mg improved to 1.8 - KCl 40 mEq PO  Hypertension  - BP 120s-130s on home on Home Cardizem 240mg  PO Daily - Will restart home Lisinopril 2.5mg  Daily on discharge  Pseudomonas UTI  - Resolved, Antibiotics discontinued - Zosyn 3/28>3/31 - Cipro 3/27> 3/28, 3/31>4/1  Dispo:  - PT/OT recommended SNF, patient declined SNF placement  - [facility], care management consult placed   Dispo: Anticipated discharge Today.  Beola CordMelvin, Alexander, MD 06/05/2017, 11:25 AM Pager: [phone]

## 2017-06-05 NOTE — Discharge Instructions (Signed)
Information on my medicine - ELIQUIS (apixaban)  This medication education was reviewed with me or my healthcare representative as part of my discharge preparation.    Why was Eliquis prescribed for you? Eliquis was prescribed for you to reduce the risk of a blood clot forming that can cause a stroke if you have a medical condition called atrial fibrillation (a type of irregular heartbeat).  What do You need to know about Eliquis ? Take your Eliquis TWICE DAILY - one tablet in the morning and one tablet in the evening with or without food. If you have difficulty swallowing the tablet whole please discuss with your pharmacist how to take the medication safely.  Take Eliquis exactly as prescribed by your doctor and DO NOT stop taking Eliquis without talking to the doctor who prescribed the medication.  Stopping may increase your risk of developing a stroke.  Refill your prescription before you run out.  After discharge, you should have regular check-up appointments with your healthcare provider that is prescribing your Eliquis.  In the future your dose may need to be changed if your kidney function or weight changes by a significant amount or as you get older.  What do you do if you miss a dose? If you miss a dose, take it as soon as you remember on the same day and resume taking twice daily.  Do not take more than one dose of ELIQUIS at the same time to make up a missed dose.  Important Safety Information A possible side effect of Eliquis is bleeding. You should call your healthcare provider right away if you experience any of the following: ? Bleeding from an injury or your nose that does not stop. ? Unusual colored urine (red or dark brown) or unusual colored stools (red or black). ? Unusual bruising for unknown reasons. ? A serious fall or if you hit your head (even if there is no bleeding).  Some medicines may interact with Eliquis and might increase your risk of bleeding or  clotting while on Eliquis. To help avoid this, consult your healthcare provider or pharmacist prior to using any new prescription or non-prescription medications, including herbals, vitamins, non-steroidal anti-inflammatory drugs (NSAIDs) and supplements.  This website has more information on Eliquis (apixaban): http://www.eliquis.com/eliquis/home   CCS      [facility] [phone]  OPEN ABDOMINAL SURGERY: POST OP INSTRUCTIONS  Always review your discharge instruction sheet given to you by the facility where your surgery was performed.  IF YOU HAVE DISABILITY OR FAMILY LEAVE FORMS, YOU MUST BRING THEM TO THE OFFICE FOR PROCESSING.  PLEASE DO NOT GIVE THEM TO YOUR DOCTOR.  1. A prescription for pain medication may be given to you upon discharge.  Take your pain medication as prescribed, if needed.  If narcotic pain medicine is not needed, then you may take acetaminophen (Tylenol) or ibuprofen (Advil) as needed. 2. Take your usually prescribed medications unless otherwise directed. 3. If you need a refill on your pain medication, please contact your pharmacy. They will contact our office to request authorization.  Prescriptions will not be filled after 5pm or on week-ends. 4. You should follow a light diet the first few days after arrival home, such as soup and crackers, pudding, etc.unless your doctor has advised otherwise. A high-fiber, low fat diet can be resumed as tolerated.   Be sure to include lots of fluids daily. Most patients will experience some swelling and bruising on the chest and neck area.  Ice   packs will help.  Swelling and bruising can take several days to resolve 5. Most patients will experience some swelling and bruising in the area of the incision. Ice pack will help. Swelling and bruising can take several days to resolve..  6. It is common to experience some constipation if taking pain medication after surgery.  Increasing fluid intake and taking a stool  softener will usually help or prevent this problem from occurring.  A mild laxative (Milk of Magnesia or Miralax) should be taken according to package directions if there are no bowel movements after 48 hours. 7.  You may have steri-strips (small skin tapes) in place directly over the incision.  These strips should be left on the skin for 7-10 days.  If your surgeon used skin glue on the incision, you may shower in 24 hours.  The glue will flake off over the next 2-3 weeks.  Any sutures or staples will be removed at the office during your follow-up visit. You may find that a light gauze bandage over your incision may keep your staples from being rubbed or pulled. You may shower and replace the bandage daily. 8. ACTIVITIES:  You may resume regular (light) daily activities beginning the next day--such as daily self-care, walking, climbing stairs--gradually increasing activities as tolerated.  You may have sexual intercourse when it is comfortable.  Refrain from any heavy lifting or straining until approved by your doctor. a. You may drive when you no longer are taking prescription pain medication, you can comfortably wear a seatbelt, and you can safely maneuver your car and apply brakes b. Return to Work: ___________________________________ 9. You should see your doctor in the office for a follow-up appointment approximately two weeks after your surgery.  Make sure that you call for this appointment within a day or two after you arrive home to insure a convenient appointment time. OTHER INSTRUCTIONS:  _____________________________________________________________ _____________________________________________________________  WHEN TO CALL YOUR DOCTOR: 1. Fever over 101.0 2. Inability to urinate 3. Nausea and/or vomiting 4. Extreme swelling or bruising 5. Continued bleeding from incision. 6. Increased pain, redness, or drainage from the incision. 7. Difficulty swallowing or breathing 8. Muscle cramping  or spasms. 9. Numbness or tingling in hands or feet or around lips.  The clinic staff is available to answer your questions during regular business hours.  Please don't hesitate to call and ask to speak to one of the nurses if you have concerns.  For further questions, please visit [email or web address]  

## 2017-06-05 NOTE — Progress Notes (Signed)
Physical Therapy Treatment Patient Details Name: Hayden Harmon MRN: [id number] DOB: 15-Jan-1941 Today's Date: 06/05/2017    History of Present Illness Pt is a 77 y.o. male admitted 05/25/17 with possibly up to 2-3 week h/o vomiting and diarrhea with >15 lb  weight loss in several weeks. Found to be in a-fib with RVR; also with AKI. CT with small bowel enteritis. S/p exploratory laparoscopy for SBO with lysis of benign adhesions on 3/27. PMH includes chronic microcytic anemia, HTN, CKD, a-fib, gout.    PT Comments    Pt performed poor activity during this session.  Pt also limited due to bowel complications.  On arrival patient saturated in stool.  Pt limited in standing and poor quality of gait observed during [facility] and back to bed.  Pt remains unrealistic in his ability to function safely at home.  At this time we are continuing to recommend SNF for short term rehab placement until strength improves.  Pt is refusing placement and based on this decision we are recommending a RW and WC at d/c.  Pt reports his brother has a RW at home he can borrow so [facility].     Follow Up Recommendations  SNF;Supervision/Assistance - 24 hour     Equipment Recommendations  Rolling walker with 5" wheels;Wheelchair cushion (measurements PT);Wheelchair (measurements PT)    Recommendations for Other Services OT consult     Precautions / Restrictions Precautions Precautions: Fall Restrictions Weight Bearing Restrictions: No    Mobility  Bed Mobility Overal bed mobility: Needs Assistance Bed Mobility: Rolling;Supine to Sit;Sit to Supine Rolling: Min guard(VCs for sequencing.  )   Supine to sit: Min assist Sit to supine: Min assist   General bed mobility comments: Increased time and effort; sequencing for log roll to decrease pain.  Assistance for trunk elevation and LE advancement.    Transfers Overall transfer level: Needs assistance Equipment used: Rolling  walker (2 wheeled) Transfers: Sit to/from Stand Sit to Stand: Min assist         General transfer comment: Cues for hand placement to and from seated surface.  Pt able to stand intermittently without assistance but remains flexed and fatigues quickly requiring min assistance to maintain standing.  Pt require standing around 5 min due to soiled presentation from bowel incontinence.  Pt tolerated session poorly and unrealistic in what he is able to function wise.  Ambulation/Gait Ambulation/Gait assistance: Min assist Ambulation Distance (Feet): 5 Feet(4 ft x2 trial to and from commode with shuffling steppage.  ) Assistive device: Rolling walker (2 wheeled) Gait Pattern/deviations: Step-to pattern;Trunk flexed;Drifts right/left   Gait velocity interpretation: Below normal speed for age/gender General Gait Details: Pt with flexed posture and resting elbows on RW hand grips due to fatigue and poor strength to maintain trunk control.  Slow buckling in stance phase.     Stairs            Wheelchair Mobility    Modified Rankin (Stroke Patients Only)       Balance Overall balance assessment: Needs assistance   Sitting balance-Leahy Scale: Fair       Standing balance-Leahy Scale: Poor                              Cognition Arousal/Alertness: Awake/alert Behavior During Therapy: Flat affect Overall Cognitive Status: Impaired/Different from baseline Area of Impairment: Safety/judgement;Problem solving  Following Commands: Follows one step commands inconsistently Safety/Judgement: Decreased awareness of safety;Decreased awareness of deficits   Problem Solving: Requires verbal cues General Comments: Pt continues to be reluctant to participate in therapy, very flat affect, pt unrealistic in his abilities to return home and function.        Exercises      General Comments        Pertinent Vitals/Pain Pain Assessment:  0-10 Pain Score: 4  Pain Location: abdomen Pain Descriptors / Indicators: Discomfort;Operative site guarding;Sore Pain Intervention(s): Monitored during session;Repositioned    Home Living                      Prior Function            PT Goals (current goals can now be found in the care plan section) Acute Rehab PT Goals Patient Stated Goal: Return home Potential to Achieve Goals: Fair Progress towards PT goals: Progressing toward goals    Frequency    Min 3X/week      PT Plan Current plan remains appropriate    Co-evaluation              AM-PAC PT "6 Clicks" Daily Activity  Outcome Measure  Difficulty turning over in bed (including adjusting bedclothes, sheets and blankets)?: Unable Difficulty moving from lying on back to sitting on the side of the bed? : Unable Difficulty sitting down on and standing up from a chair with arms (e.g., wheelchair, bedside commode, etc,.)?: Unable Help needed moving to and from a bed to chair (including a wheelchair)?: A Little Help needed walking in hospital room?: A Little Help needed climbing 3-5 steps with a railing? : A Little 6 Click Score: 12    End of Session Equipment Utilized During Treatment: Gait belt Activity Tolerance: Patient limited by pain;Other (comment) Patient left: in bed;with bed alarm set;with call bell/phone within reach Nurse Communication: Mobility status PT Visit Diagnosis: Other abnormalities of gait and mobility (R26.89);Muscle weakness (generalized) (M62.81)     Time: 1440-1510 PT Time Calculation (min) (ACUTE ONLY): 30 min  Charges:  $Therapeutic Activity: 23-37 mins                    G CodesJoycelyn Harmon, PTA pager [phone]    Hayden Harmon 06/05/2017, 3:27 PM

## 2017-06-05 NOTE — Progress Notes (Signed)
5 Days Post-Op    CC: SBO  Subjective: Tolerating a soft diet, he wants some boiled eggs, that was his only request.  Started walking but doesn't sound like he has walked much. Incision looks fine will leave staples in for now.    Objective: Vital signs in last 24 hours: Temp:  [97.6 F (36.4 C)-98.4 F (36.9 C)] 98.4 F (36.9 C) (04/01 0400) Resp:  [11-21] 11 (04/01 0654) BP: (121-149)/(93-98) 123/97 (04/01 0400) SpO2:  [98 %-99 %] 99 % (03/31 1633) Weight:  [87.1 kg (192 lb)] 87.1 kg (192 lb) (04/01 0654) Last BM Date: 06/04/17 PO not recorded  urione 1700 Stool x 2 Afebrile, VSS Na 134, K+ 3.3 mag 1.8    Intake/Output from previous day: 03/31 0701 - 04/01 0700 In: -  Out: 1701 [Urine:1700; Stool:1] Intake/Output this shift: Total I/O In: 240 [P.O.:240] Out: -   General appearance: alert, cooperative and no distress GI: soft, still sore, incision looks fine.  + BS, +BM  Lab Results:  Recent Labs    06/04/17 0305  WBC 7.2  HGB 10.8*  HCT 36.8*  PLT 326    BMET Recent Labs    06/04/17 0305 06/05/17 0826  NA 136 134*  K 4.0 3.3*  CL 108 104  CO2 18* 20*  GLUCOSE 80 102*  BUN 9 7  CREATININE 0.95 1.00  CALCIUM 7.7* 8.0*   PT/INR No results for input(s): LABPROT, INR in the last 72 hours.  No results for input(s): AST, ALT, ALKPHOS, BILITOT, PROT, ALBUMIN in the last 168 hours.   Lipase     Component Value Date/Time   LIPASE 43 05/25/2017 1609     Medications: . apixaban  5 mg Oral BID  . aspirin  81 mg Oral Daily  . Chlorhexidine Gluconate Cloth  6 each Topical Once  . ciprofloxacin  500 mg Oral BID  . diltiazem  240 mg Oral Daily  . pantoprazole (PROTONIX) IV  40 mg Intravenous Q12H  . potassium chloride  40 mEq Oral Once   . methocarbamol (ROBAXIN)  IV Stopped (06/05/17 [id number])   Assessment/Plan A-fib with RVR- on diltiazem Remote MI- holding plavix (last dose 3/23). Hg stable 12.1 from 11.5 CKD-creatinineimproving  1.21 HTN UTI - started on cipro 3/27, Urine culturegrew pseudomonas aeruginosaand patient was switched to zosyn 3/28  SBO 2/2 internal adhesions S/pExporatorylaparotomy, lysis of adhesions 3/27 Dr. Derrell LollingIngram  POD5 - NG tube with250cc/24 hour, passing flatus no BM -Patient does not need further antibiotics from surgical standpoint.  - NG out soft diet - tolerating it well, + BM  ID -zosyn 3/28>>,cipro 3/27>>3/28, cefotetan x1 3/27 FEN -IVF,soft diet VTE -apixaban Foley -d/c3/28 Follow up -staples, Derrell LollingIngram  Plan:  He can go home anytime from our standpoint.  I will get him set up to have staples removed from the incision in our office and follow up with Dr. Derrell LollingIngram.  Post op appointments and instructions are in the AVS.       LOS: 11 days    Hayden Harmon 06/05/2017 [phone]

## 2017-06-05 NOTE — Care Management Note (Signed)
Case Management Note Donn PieriniKristi Arwin Bisceglia RN, BSN Unit 4E-Case Manager [phone]  Patient Details  Name: Hayden ClientJohn N Harmon MRN: [id number] Date of Birth: 08-13-1940  Subjective/Objective:  Pt admitted with SBO, s/p exp/lap                 Action/Plan: PTA pt lived at home, sister and brother assist- recommendation for SNF however pt refusing and wants to return home- [facility]- spoke with pt at bedside along with sister and brother. [facility]- and [facility]- [facility]/OT/aide- also discussed need for RW or W/C- per medicare can not get both- pt will need w/c most- as he has a RW that he can borrow when needed- (out of pocket cost for RW is $58 with [facility])- Jermaine with [facility] aware of DME need w/c to be delivered to room prior to discharge- Eliquis is new for pt- 30 day free card provided for discharge- benefits check is pending from this morning.   Correct Address for pt is - [address], [place] [address], phone: [phone]  Expected Discharge Date:  06/05/17               Expected Discharge Plan:  Home w Home Health Services  In-House Referral:  Clinical Social Work  Discharge planning Services  CM Consult, Medication Assistance  Post Acute Care Choice:  Durable Medical Equipment, Home Health Choice offered to:  Patient, Sibling  DME Arranged:  Government social research officerWheelchair manual DME Agency:  [facility]  HH Arranged:  PT, OT, Nurse's Aide HH Agency:  [facility]  Status of Service:  Completed, signed off  If discussed at Long Length of Stay Meetings, dates discussed:    Discharge Disposition: home/home health   Additional Comments:  Darrold SpanWebster, Angelyne Terwilliger Hall, RN 06/05/2017, 3:23 PM

## 2017-06-06 NOTE — Telephone Encounter (Signed)
Lm to call back

## 2017-06-06 NOTE — Telephone Encounter (Addendum)
Transitional [facility] Clinic Post-discharge Follow-Up Phone Call:  Date of Discharge: 06/05/2017 Principal Discharge Diagnosis(es): SBO, acute renal failure, a-fib Post-discharge Communication: Attempt #2 to reach patient and complete post-discharge follow-up phone call. Call placed to #[phone] and [phone]; unable to reach patient. HIPPA compliant voicemail left requesting return call. Call Completed: No       L. Leward Quanucatte, RN, BSN

## 2017-06-09 ENCOUNTER — Other Ambulatory Visit: Payer: Self-pay

## 2017-06-09 ENCOUNTER — Ambulatory Visit (INDEPENDENT_AMBULATORY_CARE_PROVIDER_SITE_OTHER): Payer: Medicare Other | Admitting: Internal Medicine

## 2017-06-09 VITALS — BP 105/58 | HR 62 | Temp 97.5°F | Ht 71.0 in | Wt 189.3 lb

## 2017-06-09 DIAGNOSIS — E876 Hypokalemia: Secondary | ICD-10-CM

## 2017-06-09 DIAGNOSIS — Z79899 Other long term (current) drug therapy: Secondary | ICD-10-CM

## 2017-06-09 DIAGNOSIS — M109 Gout, unspecified: Secondary | ICD-10-CM

## 2017-06-09 DIAGNOSIS — I4891 Unspecified atrial fibrillation: Secondary | ICD-10-CM | POA: Diagnosis not present

## 2017-06-09 DIAGNOSIS — D500 Iron deficiency anemia secondary to blood loss (chronic): Secondary | ICD-10-CM

## 2017-06-09 DIAGNOSIS — N179 Acute kidney failure, unspecified: Secondary | ICD-10-CM

## 2017-06-09 DIAGNOSIS — K56609 Unspecified intestinal obstruction, unspecified as to partial versus complete obstruction: Secondary | ICD-10-CM

## 2017-06-09 DIAGNOSIS — I10 Essential (primary) hypertension: Secondary | ICD-10-CM

## 2017-06-09 DIAGNOSIS — Z87448 Personal history of other diseases of urinary system: Secondary | ICD-10-CM

## 2017-06-09 DIAGNOSIS — M1000 Idiopathic gout, unspecified site: Secondary | ICD-10-CM

## 2017-06-09 DIAGNOSIS — Z87440 Personal history of urinary (tract) infections: Secondary | ICD-10-CM

## 2017-06-09 DIAGNOSIS — Z7901 Long term (current) use of anticoagulants: Secondary | ICD-10-CM | POA: Diagnosis not present

## 2017-06-09 DIAGNOSIS — Z8719 Personal history of other diseases of the digestive system: Secondary | ICD-10-CM | POA: Diagnosis not present

## 2017-06-09 DIAGNOSIS — Z87891 Personal history of nicotine dependence: Secondary | ICD-10-CM

## 2017-06-09 DIAGNOSIS — D509 Iron deficiency anemia, unspecified: Secondary | ICD-10-CM | POA: Diagnosis not present

## 2017-06-09 MED ORDER — LISINOPRIL 2.5 MG PO TABS: 2.5000 mg | ORAL_TABLET | Freq: Every day | ORAL | 0 refills | Status: DC

## 2017-06-09 MED ORDER — FERROUS SULFATE 325 (65 FE) MG PO TABS: 325.0000 mg | ORAL_TABLET | Freq: Every day | ORAL | 0 refills | Status: DC

## 2017-06-09 NOTE — Assessment & Plan Note (Signed)
Assessment Noted to have AKI during hospitalization in March 2019 likely secondary to dehydration. AKI resolved with IVF. Cr 1.00 on discharge  Plan - Check BMP today

## 2017-06-09 NOTE — Patient Instructions (Addendum)
FOLLOW-UP INSTRUCTIONS When: 07/28/2017 (appointment already scheduled) For: HTN and afib follow-up What to bring: medications   Mr. Corine ShelterWatkins,  It was a pleasure to meet you today.  I am glad to hear that you are doing much better after leaving the hospital. Please make sure to follow up with the heart doctors and the surgeons to make sure you keep healing.  We are checking some bloodwork today. I will let you know the results when we get them back. We also are giving you a stool card to go home with. Please drop this off with the clinic once you are ready.  Please start taking ferrous sulfate 325mg  once a day with a meal.  Please see us back on 07/28/2017.  If you have any questions or concerns, call our clinic at [phone] or after hours call [phone] and ask for the internal medicine resident on call.

## 2017-06-09 NOTE — Assessment & Plan Note (Signed)
Assessment Previous labwork in 04/2017 consistent with iron deficiency anemia. Patient not currently taking oral iron, although he was started on PO iron at last visit. [facility], however I do not see record of this in our chart. [place], [place] been able to obtain records.   Unclear etiology of iron deficiency anemia in elderly man, concerning for occult blood loss, possibly GI.  Plan - FIC testing - Ferrous sulfate 325mg  daily - Check CBC today

## 2017-06-09 NOTE — Assessment & Plan Note (Signed)
Noted during hospitalization 05/2017. K 3.3 on discharge. - Check BMP today

## 2017-06-09 NOTE — Assessment & Plan Note (Addendum)
Advised patient to pick up refill of allopurinol 200mg  daily that is available at patient's pharmacy (ordered 05/08/2017)

## 2017-06-09 NOTE — Assessment & Plan Note (Addendum)
Assessment Noted during hospitalization in March 2019. Likely secondary to acute illness, dehydration, and inability to take PO diltiazem prior to hospitalization. HR well-controlled at 62, in NSR on exam. No CP, SOB, or palpitations  Plan - Continue Eliquis 5mg  BID - Check CBC today, given recent initiation of anticoagulation - Continue diltiazem 240mg  daily - F/u with afib clinic on 06/13/2017

## 2017-06-09 NOTE — Assessment & Plan Note (Signed)
Assessment Recently hospitalized 3/21-4/1 for small bowel enteritis and mechanical SBO secondary to benign adhesions, status post exploratory laparoscopy on 3/27. He has follow-up appointment with General Surgery on 4/8. Since discharge, he states he has been doing well. No GI symptoms. He has been tolerating PO intake. Surgical site without evidence of infection, afebrile and HDS. Home health has not been set up yet, but family states they have an appointment tomorrow.  Plan - follow up with Gen Surg on 4/8

## 2017-06-09 NOTE — Progress Notes (Signed)
   CC: follow-up of SBO  HPI:  [name] is a 77 y.o. male with PMH of afib, hx of tobacco use, HTN, and gout who presents for a hospital follow up.  He was recently hospitalized 3/21-4/1 for small bowel enteritis and mechanical SBO secondary to benign adhesions, s/p ex-lap on 3/27. Has follow up with Gen Surg on 4/8. He was also noted to have Afib with RVR, scheduled in Afib clinic 4/9. Started on Eliquis 5mg  BID. He had a UTI, treated with antibiotics, as well as an AKI that resolved with IVF. [facility] SNF [facility]  Since discharge, patient states he has been doing well. He denies abdominal pain, nausea, vomiting, constipation, or diarrhea. He has been tolerating PO intake. He denies hematochezia or melena. Denies chest pain or palpitations. Denies headaches, lightheadedness, or dizziness. Denies dysuria or hematuria. Reports compliance with his discharge medications.  He states home health has not been started yet, however they are set to start tomorrow. He typically ambulates with a walker. He is accompanied by sister and brother. He states he lives at home with his brother.  Past Medical History:  Diagnosis Date  . A-fib (HCC)   . CKD (chronic kidney disease)   . Essential hypertension 04/10/2017  . Gout   . History of tobacco use   . Iron deficiency anemia   . MI, old [id number]   Cath/stent recommended, refused PCI   Review of Systems:   GEN: Negative for fevers NEURO: Neg for lightheadedness or dizziness, or headache CV: Negative for chest pain or palpitations PULM: Negative for shortness of breath ABD: Negative for N/V, abd pain, D/C, hematochezia, or melena GU: Negative for dysuria or hematuria  Physical Exam:  Vitals:   06/09/17 1420  BP: (!) 105/58  Pulse: 62  Temp: (!) 97.5 F (36.4 C)  TempSrc: Oral  Weight: 189 lb 4.8 oz (85.9 kg)  Height: 5\' 11"  (1.803 m)   GEN: Sitting in wheelchair in NAD; appears younger than stated  age CV: NR & RR, no m/r/g PULM: CTAB, no wheezes or rales ABD: Soft, NT, ND, +BS. Midline sutures in place. Surgical site without evidence of infection MSK: No LE edema  Assessment & Plan:   See Encounters Tab for problem based charting.  Patient discussed with Dr. Cleda DaubE. Hoffman

## 2017-06-09 NOTE — Assessment & Plan Note (Addendum)
Assessment BP well controlled on lisinopril 2.5mg  daily and diltiazem 240mg  daily  Plan - Refilled lisinopril 2.5mg  daily - Continue diltiazem 240mg  daily - F/u with PCP on 07/28/2017

## 2017-06-10 ENCOUNTER — Other Ambulatory Visit: Payer: Self-pay | Admitting: Internal Medicine

## 2017-06-10 DIAGNOSIS — Z7982 Long term (current) use of aspirin: Secondary | ICD-10-CM | POA: Diagnosis not present

## 2017-06-10 DIAGNOSIS — Z7901 Long term (current) use of anticoagulants: Secondary | ICD-10-CM | POA: Diagnosis not present

## 2017-06-10 DIAGNOSIS — I4891 Unspecified atrial fibrillation: Secondary | ICD-10-CM | POA: Diagnosis not present

## 2017-06-10 DIAGNOSIS — D509 Iron deficiency anemia, unspecified: Secondary | ICD-10-CM | POA: Diagnosis not present

## 2017-06-10 DIAGNOSIS — Z48815 Encounter for surgical aftercare following surgery on the digestive system: Secondary | ICD-10-CM | POA: Diagnosis not present

## 2017-06-10 DIAGNOSIS — I129 Hypertensive chronic kidney disease with stage 1 through stage 4 chronic kidney disease, or unspecified chronic kidney disease: Secondary | ICD-10-CM | POA: Diagnosis not present

## 2017-06-10 DIAGNOSIS — K529 Noninfective gastroenteritis and colitis, unspecified: Secondary | ICD-10-CM | POA: Diagnosis not present

## 2017-06-10 DIAGNOSIS — Z87891 Personal history of nicotine dependence: Secondary | ICD-10-CM | POA: Diagnosis not present

## 2017-06-10 DIAGNOSIS — N189 Chronic kidney disease, unspecified: Secondary | ICD-10-CM | POA: Diagnosis not present

## 2017-06-10 DIAGNOSIS — M109 Gout, unspecified: Secondary | ICD-10-CM | POA: Diagnosis not present

## 2017-06-10 DIAGNOSIS — I350 Nonrheumatic aortic (valve) stenosis: Secondary | ICD-10-CM | POA: Diagnosis not present

## 2017-06-10 LAB — BMP8+ANION GAP
Anion Gap: 17 mmol/L (ref 10.0–18.0)
BUN / CREAT RATIO: 7 — AB (ref 10–24)
BUN: 8 mg/dL (ref 8–27)
CALCIUM: 8.5 mg/dL — AB (ref 8.6–10.2)
CO2: 22 mmol/L (ref 20–29)
Chloride: 98 mmol/L (ref 96–106)
Creatinine, Ser: 1.08 mg/dL (ref 0.76–1.27)
GFR, EST AFRICAN AMERICAN: 76 mL/min/{1.73_m2} (ref >59)
GFR, EST NON AFRICAN AMERICAN: 65 mL/min/{1.73_m2} (ref >59)
Glucose: 94 mg/dL (ref 65–99)
POTASSIUM: 3.8 mmol/L (ref 3.5–5.2)
Sodium: 137 mmol/L (ref 134–144)

## 2017-06-10 LAB — CBC
HEMATOCRIT: 36.7 % — AB (ref 37.5–51.0)
HEMOGLOBIN: 11.1 g/dL — AB (ref 13.0–17.7)
MCH: 18.9 pg — AB (ref 26.6–33.0)
MCHC: 30.2 g/dL — AB (ref 31.5–35.7)
MCV: 63 fL — ABNORMAL LOW (ref 79–97)
Platelets: 432 10*3/uL — ABNORMAL HIGH (ref 150–379)
RBC: 5.86 x10E6/uL — ABNORMAL HIGH (ref 4.14–5.80)
RDW: 23.7 % — AB (ref 12.3–15.4)
WBC: 5.6 10*3/uL (ref 3.4–10.8)

## 2017-06-11 DIAGNOSIS — D509 Iron deficiency anemia, unspecified: Secondary | ICD-10-CM | POA: Diagnosis not present

## 2017-06-12 ENCOUNTER — Telehealth: Payer: Self-pay | Admitting: Internal Medicine

## 2017-06-12 ENCOUNTER — Other Ambulatory Visit: Payer: Medicare Other

## 2017-06-12 DIAGNOSIS — D509 Iron deficiency anemia, unspecified: Secondary | ICD-10-CM

## 2017-06-12 NOTE — Progress Notes (Signed)
Internal Medicine Clinic Attending  Case discussed with Dr. Huang at the time of the visit.  We reviewed the resident's history and exam and pertinent patient test results.  I agree with the assessment, diagnosis, and plan of care documented in the resident's note. 

## 2017-06-12 NOTE — Telephone Encounter (Signed)
Threasa AlphaJim Hoffman [phone] from [facility], verbal orders   Physical therapy 2x for 3weeks Mobility/ Carley HammedEva by nurse for disease and medication management  Home health aid for bathing 1x 1week and 2x for 2weeks

## 2017-06-12 NOTE — Telephone Encounter (Signed)
Thank you.  I am in agreement.  I am scheduled to see him for the first time in May.

## 2017-06-12 NOTE — Telephone Encounter (Signed)
Verbal authorization left on Jim's confidential VM for orders as below. Will route to PCP to see if we are in agreement.  Kinnie FeilL. Boluwatife Mutchler, RN, BSN

## 2017-06-13 ENCOUNTER — Other Ambulatory Visit: Payer: Self-pay

## 2017-06-13 ENCOUNTER — Ambulatory Visit (HOSPITAL_COMMUNITY)
Admit: 2017-06-13 | Discharge: 2017-06-13 | Disposition: A | Discharge status: Home / Self Care | Payer: Medicare Other | Source: Ambulatory Visit | Attending: Nurse Practitioner | Admitting: Nurse Practitioner

## 2017-06-13 ENCOUNTER — Encounter (HOSPITAL_COMMUNITY): Payer: Self-pay | Admitting: Nurse Practitioner

## 2017-06-13 VITALS — BP 100/56 | HR 66 | Ht 71.0 in | Wt 189.0 lb

## 2017-06-13 DIAGNOSIS — I4891 Unspecified atrial fibrillation: Secondary | ICD-10-CM | POA: Diagnosis not present

## 2017-06-13 DIAGNOSIS — I129 Hypertensive chronic kidney disease with stage 1 through stage 4 chronic kidney disease, or unspecified chronic kidney disease: Secondary | ICD-10-CM | POA: Diagnosis not present

## 2017-06-13 DIAGNOSIS — Z7982 Long term (current) use of aspirin: Secondary | ICD-10-CM | POA: Diagnosis not present

## 2017-06-13 DIAGNOSIS — I252 Old myocardial infarction: Secondary | ICD-10-CM | POA: Insufficient documentation

## 2017-06-13 DIAGNOSIS — Z8719 Personal history of other diseases of the digestive system: Secondary | ICD-10-CM | POA: Insufficient documentation

## 2017-06-13 DIAGNOSIS — M109 Gout, unspecified: Secondary | ICD-10-CM | POA: Diagnosis not present

## 2017-06-13 DIAGNOSIS — Z7901 Long term (current) use of anticoagulants: Secondary | ICD-10-CM | POA: Insufficient documentation

## 2017-06-13 DIAGNOSIS — I482 Chronic atrial fibrillation: Secondary | ICD-10-CM

## 2017-06-13 DIAGNOSIS — I4821 Permanent atrial fibrillation: Secondary | ICD-10-CM

## 2017-06-13 DIAGNOSIS — N189 Chronic kidney disease, unspecified: Secondary | ICD-10-CM | POA: Insufficient documentation

## 2017-06-13 DIAGNOSIS — Z79899 Other long term (current) drug therapy: Secondary | ICD-10-CM | POA: Insufficient documentation

## 2017-06-13 DIAGNOSIS — D509 Iron deficiency anemia, unspecified: Secondary | ICD-10-CM | POA: Diagnosis not present

## 2017-06-13 NOTE — Progress Notes (Signed)
Primary Care Physician: Doneen PoissonKlima, Lawrence, MD Referring Physician: [facility] f/u   Hayden Harmon is a 77 y.o. male with a h/o HTN, anemia, that was seen in the ER 3/17 with N/V x 4 days and found to have afib with RVR. He was asked to f/u in the afib clinic. Pt presented  here with afib with rvr of 170+.bpm  He stated that he has not been able to keep any food down x 3 weeks, with intermittent diarrhea and had lost 13 lbs. He lives with his brother who was with him  as well as the sister. He was not able to take any of his meds as he vomited them back up. He was sent to the ER and pt was admitted. He was found to have small bowel obstruction and went on to have surgery .  In the afib clinic, 4/9, he is much improved and reports that he is eating now. He is in afib rate controlled. As he feels better today than the initial visit, he tells me that [place].He just moved here in January of this year. His medical records have been requested by his PCP. He is taking his eliquis bid for stoke prevention with chadsvasc score of at least 3.  Today, he denies symptoms of palpitations, chest pain, shortness of breath, orthopnea, PND, lower extremity edema, dizziness, presyncope, syncope, or neurologic sequela. The patient is tolerating medications without difficulties and is otherwise without complaint today.   Past Medical History:  Diagnosis Date  . A-fib (HCC)   . CKD (chronic kidney disease)   . Essential hypertension 04/10/2017  . Gout   . History of tobacco use   . Iron deficiency anemia   . MI, old 1102015   Cath/stent recommended, refused PCI   Past Surgical History:  Procedure Laterality Date  . INGUINAL HERNIA REPAIR    . LAPAROTOMY N/A 05/31/2017   Procedure: EXPLORATORY LAPAROTOMY, LYSIS OF ADHESIONS;  Surgeon: Claud KelpIngram, Haywood, MD;  Location: [facility] OR;  Service: General;  Laterality: N/A;    Current Outpatient Medications    Medication Sig Dispense Refill  . allopurinol (ZYLOPRIM) 100 MG tablet Take 2 tablets (200 mg total) by mouth daily. 180 tablet 3  . apixaban (ELIQUIS) 5 MG TABS tablet Take 1 tablet (5 mg total) by mouth 2 (two) times daily. 60 tablet 2  . aspirin EC 81 MG tablet Take 81 mg by mouth daily.    Marland Kitchen. diltiazem (DILT-XR) 240 MG 24 hr capsule Take 1 capsule (240 mg total) by mouth daily. 90 capsule 3  . ferrous sulfate 325 (65 FE) MG tablet Take 1 tablet (325 mg total) by mouth daily. With food 90 tablet 0  . lisinopril (PRINIVIL,ZESTRIL) 2.5 MG tablet Take 1 tablet (2.5 mg total) by mouth daily. 90 tablet 0   No current facility-administered medications for this encounter.     No Known Allergies  Social History   Socioeconomic History  . Marital status: Legally Separated    Spouse name: Not on file  . Number of children: Not on file  . Years of education: Not on file  . Highest education level: Not on file  Occupational History  . Not on file  Social Needs  . Financial resource strain: Not on file  . Food insecurity:    Worry: Not on file    Inability: Not on file  . Transportation needs:  Medical: Not on file    Non-medical: Not on file  Tobacco Use  . Smoking status: Never Smoker  . Smokeless tobacco: Never Used  Substance and Sexual Activity  . Alcohol use: Never    Frequency: Never  . Drug use: Never  . Sexual activity: Not Currently  Lifestyle  . Physical activity:    Days per week: Not on file    Minutes per session: Not on file  . Stress: Not on file  Relationships  . Social connections:    Talks on phone: Not on file    Gets together: Not on file    Attends religious service: Not on file    Active member of club or organization: Not on file    Attends meetings of clubs or organizations: Not on file    Relationship status: Not on file  . Intimate partner violence:    Fear of current or ex partner: Not on file    Emotionally abused: Not on file    Physically  abused: Not on file    Forced sexual activity: Not on file  Other Topics Concern  . Not on file  Social History Narrative  . Not on file    No family history on file.  ROS- All systems are reviewed and negative except as per the HPI above  Physical Exam: Vitals:   06/13/17 1505  BP: (!) 100/56  Pulse: 66  Weight: 189 lb (85.7 kg)  Height: 5\' 11"  (1.803 m)   Wt Readings from Last 3 Encounters:  06/13/17 189 lb (85.7 kg)  06/09/17 189 lb 4.8 oz (85.9 kg)  06/05/17 192 lb (87.1 kg)    Labs: Lab Results  Component Value Date   NA 137 06/09/2017   K 3.8 06/09/2017   CL 98 06/09/2017   CO2 22 06/09/2017   GLUCOSE 94 06/09/2017   BUN 8 06/09/2017   CREATININE 1.08 06/09/2017   CALCIUM 8.5 (L) 06/09/2017   MG 1.8 06/05/2017   No results found for: INR No results found for: CHOL, HDL, LDLCALC, TRIG   GEN- The patient is well appearing, alert and oriented x 3 today.   Head- normocephalic, atraumatic Eyes-  Sclera clear, conjunctiva pink Ears- hearing intact Oropharynx- clear Neck- supple, no JVP Lymph- no cervical lymphadenopathy Lungs- Clear to ausculation bilaterally, normal work of breathing Heart-  irregular rate and rhythm, no murmurs, rubs or gallops, PMI not laterally displaced GI- soft, NT, ND, + BS Extremities- no clubbing, cyanosis, or edema MS- no significant deformity or atrophy Skin- no rash or lesion Psych- euthymic mood, full affect Neuro- strength and sensation are intact  EKG-afib, rate controlled at 66 bpm, qrs int 90 ms, qtc 444 ms    Assessment and Plan: 1.  Afib Now rate controlled Per pt this is not a new dx He states that PCP requested his medical records form [place] He will continue cardizem 240 mg daily  Continue eliquis 5 mg bid for chadsvasc score of 3  Will update echo  2. SBO Resolved with surgery Pt has now returned to eating Much improved  F/u here in 2-4 weeks  Lupita Leash C. [facility] [address] [place], [place] [address] [phone]

## 2017-06-14 DIAGNOSIS — I4891 Unspecified atrial fibrillation: Secondary | ICD-10-CM | POA: Diagnosis not present

## 2017-06-14 DIAGNOSIS — N189 Chronic kidney disease, unspecified: Secondary | ICD-10-CM | POA: Diagnosis not present

## 2017-06-14 DIAGNOSIS — I129 Hypertensive chronic kidney disease with stage 1 through stage 4 chronic kidney disease, or unspecified chronic kidney disease: Secondary | ICD-10-CM | POA: Diagnosis not present

## 2017-06-14 DIAGNOSIS — K529 Noninfective gastroenteritis and colitis, unspecified: Secondary | ICD-10-CM | POA: Diagnosis not present

## 2017-06-14 DIAGNOSIS — D509 Iron deficiency anemia, unspecified: Secondary | ICD-10-CM | POA: Diagnosis not present

## 2017-06-14 DIAGNOSIS — Z48815 Encounter for surgical aftercare following surgery on the digestive system: Secondary | ICD-10-CM | POA: Diagnosis not present

## 2017-06-14 LAB — FECAL OCCULT BLOOD, IMMUNOCHEMICAL: Fecal Occult Bld: NEGATIVE

## 2017-06-16 DIAGNOSIS — N189 Chronic kidney disease, unspecified: Secondary | ICD-10-CM | POA: Diagnosis not present

## 2017-06-16 DIAGNOSIS — I129 Hypertensive chronic kidney disease with stage 1 through stage 4 chronic kidney disease, or unspecified chronic kidney disease: Secondary | ICD-10-CM | POA: Diagnosis not present

## 2017-06-16 DIAGNOSIS — K529 Noninfective gastroenteritis and colitis, unspecified: Secondary | ICD-10-CM | POA: Diagnosis not present

## 2017-06-16 DIAGNOSIS — Z48815 Encounter for surgical aftercare following surgery on the digestive system: Secondary | ICD-10-CM | POA: Diagnosis not present

## 2017-06-16 DIAGNOSIS — I4891 Unspecified atrial fibrillation: Secondary | ICD-10-CM | POA: Diagnosis not present

## 2017-06-16 DIAGNOSIS — D509 Iron deficiency anemia, unspecified: Secondary | ICD-10-CM | POA: Diagnosis not present

## 2017-06-19 ENCOUNTER — Telehealth: Payer: Self-pay

## 2017-06-19 DIAGNOSIS — I129 Hypertensive chronic kidney disease with stage 1 through stage 4 chronic kidney disease, or unspecified chronic kidney disease: Secondary | ICD-10-CM | POA: Diagnosis not present

## 2017-06-19 DIAGNOSIS — I4891 Unspecified atrial fibrillation: Secondary | ICD-10-CM | POA: Diagnosis not present

## 2017-06-19 DIAGNOSIS — N189 Chronic kidney disease, unspecified: Secondary | ICD-10-CM | POA: Diagnosis not present

## 2017-06-19 DIAGNOSIS — K529 Noninfective gastroenteritis and colitis, unspecified: Secondary | ICD-10-CM | POA: Diagnosis not present

## 2017-06-19 DIAGNOSIS — Z48815 Encounter for surgical aftercare following surgery on the digestive system: Secondary | ICD-10-CM | POA: Diagnosis not present

## 2017-06-19 DIAGNOSIS — D509 Iron deficiency anemia, unspecified: Secondary | ICD-10-CM | POA: Diagnosis not present

## 2017-06-19 NOTE — Telephone Encounter (Signed)
Hayden Harmon with [facility] requesting VO for OT. Please call back. 

## 2017-06-19 NOTE — Telephone Encounter (Signed)
Called Samara DeistKathryn OT , [facility] -  requesting verbal order for "OT once a week x 4 weeks". VO given - if not appropriate, let me know. Thanks

## 2017-06-21 NOTE — Telephone Encounter (Signed)
OK w me.  

## 2017-06-22 DIAGNOSIS — D509 Iron deficiency anemia, unspecified: Secondary | ICD-10-CM | POA: Diagnosis not present

## 2017-06-22 DIAGNOSIS — Z48815 Encounter for surgical aftercare following surgery on the digestive system: Secondary | ICD-10-CM | POA: Diagnosis not present

## 2017-06-22 DIAGNOSIS — K529 Noninfective gastroenteritis and colitis, unspecified: Secondary | ICD-10-CM | POA: Diagnosis not present

## 2017-06-22 DIAGNOSIS — I4891 Unspecified atrial fibrillation: Secondary | ICD-10-CM | POA: Diagnosis not present

## 2017-06-22 DIAGNOSIS — I129 Hypertensive chronic kidney disease with stage 1 through stage 4 chronic kidney disease, or unspecified chronic kidney disease: Secondary | ICD-10-CM | POA: Diagnosis not present

## 2017-06-22 DIAGNOSIS — N189 Chronic kidney disease, unspecified: Secondary | ICD-10-CM | POA: Diagnosis not present

## 2017-06-23 ENCOUNTER — Ambulatory Visit (HOSPITAL_COMMUNITY)
Admission: RE | Admit: 2017-06-23 | Discharge: 2017-06-23 | Disposition: A | Discharge status: Home / Self Care | Payer: Medicare Other | Source: Ambulatory Visit | Attending: Nurse Practitioner | Admitting: Nurse Practitioner

## 2017-06-23 DIAGNOSIS — I4821 Permanent atrial fibrillation: Secondary | ICD-10-CM

## 2017-06-23 DIAGNOSIS — I482 Chronic atrial fibrillation: Secondary | ICD-10-CM | POA: Diagnosis not present

## 2017-06-23 DIAGNOSIS — Z48815 Encounter for surgical aftercare following surgery on the digestive system: Secondary | ICD-10-CM | POA: Diagnosis not present

## 2017-06-23 DIAGNOSIS — D509 Iron deficiency anemia, unspecified: Secondary | ICD-10-CM | POA: Diagnosis not present

## 2017-06-23 DIAGNOSIS — K529 Noninfective gastroenteritis and colitis, unspecified: Secondary | ICD-10-CM | POA: Diagnosis not present

## 2017-06-23 DIAGNOSIS — N189 Chronic kidney disease, unspecified: Secondary | ICD-10-CM | POA: Diagnosis not present

## 2017-06-23 DIAGNOSIS — I129 Hypertensive chronic kidney disease with stage 1 through stage 4 chronic kidney disease, or unspecified chronic kidney disease: Secondary | ICD-10-CM | POA: Diagnosis not present

## 2017-06-23 DIAGNOSIS — I4891 Unspecified atrial fibrillation: Secondary | ICD-10-CM | POA: Diagnosis not present

## 2017-06-23 NOTE — Progress Notes (Signed)
  Echocardiogram 2D Echocardiogram has been performed.  Hayden Harmon, Hayden Harmon F 06/23/2017, 4:00 PM

## 2017-06-26 DIAGNOSIS — N189 Chronic kidney disease, unspecified: Secondary | ICD-10-CM | POA: Diagnosis not present

## 2017-06-26 DIAGNOSIS — I4891 Unspecified atrial fibrillation: Secondary | ICD-10-CM | POA: Diagnosis not present

## 2017-06-26 DIAGNOSIS — Z48815 Encounter for surgical aftercare following surgery on the digestive system: Secondary | ICD-10-CM | POA: Diagnosis not present

## 2017-06-26 DIAGNOSIS — I129 Hypertensive chronic kidney disease with stage 1 through stage 4 chronic kidney disease, or unspecified chronic kidney disease: Secondary | ICD-10-CM | POA: Diagnosis not present

## 2017-06-26 DIAGNOSIS — K529 Noninfective gastroenteritis and colitis, unspecified: Secondary | ICD-10-CM | POA: Diagnosis not present

## 2017-06-26 DIAGNOSIS — D509 Iron deficiency anemia, unspecified: Secondary | ICD-10-CM | POA: Diagnosis not present

## 2017-06-28 ENCOUNTER — Telehealth: Payer: Self-pay | Admitting: Internal Medicine

## 2017-06-28 DIAGNOSIS — I4891 Unspecified atrial fibrillation: Secondary | ICD-10-CM | POA: Diagnosis not present

## 2017-06-28 DIAGNOSIS — Z48815 Encounter for surgical aftercare following surgery on the digestive system: Secondary | ICD-10-CM | POA: Diagnosis not present

## 2017-06-28 DIAGNOSIS — K529 Noninfective gastroenteritis and colitis, unspecified: Secondary | ICD-10-CM | POA: Diagnosis not present

## 2017-06-28 DIAGNOSIS — D509 Iron deficiency anemia, unspecified: Secondary | ICD-10-CM | POA: Diagnosis not present

## 2017-06-28 DIAGNOSIS — I129 Hypertensive chronic kidney disease with stage 1 through stage 4 chronic kidney disease, or unspecified chronic kidney disease: Secondary | ICD-10-CM | POA: Diagnosis not present

## 2017-06-28 DIAGNOSIS — N189 Chronic kidney disease, unspecified: Secondary | ICD-10-CM | POA: Diagnosis not present

## 2017-06-28 NOTE — Telephone Encounter (Signed)
[facility] [phone]; requesting verbal order for PT

## 2017-06-28 NOTE — Telephone Encounter (Signed)
Called Amber,PT  [facility] - no answer , left message to call the office back.

## 2017-06-30 DIAGNOSIS — I4891 Unspecified atrial fibrillation: Secondary | ICD-10-CM | POA: Diagnosis not present

## 2017-06-30 DIAGNOSIS — N189 Chronic kidney disease, unspecified: Secondary | ICD-10-CM | POA: Diagnosis not present

## 2017-06-30 DIAGNOSIS — D509 Iron deficiency anemia, unspecified: Secondary | ICD-10-CM | POA: Diagnosis not present

## 2017-06-30 DIAGNOSIS — I129 Hypertensive chronic kidney disease with stage 1 through stage 4 chronic kidney disease, or unspecified chronic kidney disease: Secondary | ICD-10-CM | POA: Diagnosis not present

## 2017-06-30 DIAGNOSIS — Z48815 Encounter for surgical aftercare following surgery on the digestive system: Secondary | ICD-10-CM | POA: Diagnosis not present

## 2017-06-30 DIAGNOSIS — K529 Noninfective gastroenteritis and colitis, unspecified: Secondary | ICD-10-CM | POA: Diagnosis not present

## 2017-07-03 DIAGNOSIS — K529 Noninfective gastroenteritis and colitis, unspecified: Secondary | ICD-10-CM | POA: Diagnosis not present

## 2017-07-03 DIAGNOSIS — Z48815 Encounter for surgical aftercare following surgery on the digestive system: Secondary | ICD-10-CM | POA: Diagnosis not present

## 2017-07-03 DIAGNOSIS — I4891 Unspecified atrial fibrillation: Secondary | ICD-10-CM | POA: Diagnosis not present

## 2017-07-03 DIAGNOSIS — D509 Iron deficiency anemia, unspecified: Secondary | ICD-10-CM | POA: Diagnosis not present

## 2017-07-03 DIAGNOSIS — I129 Hypertensive chronic kidney disease with stage 1 through stage 4 chronic kidney disease, or unspecified chronic kidney disease: Secondary | ICD-10-CM | POA: Diagnosis not present

## 2017-07-03 DIAGNOSIS — N189 Chronic kidney disease, unspecified: Secondary | ICD-10-CM | POA: Diagnosis not present

## 2017-07-04 ENCOUNTER — Ambulatory Visit (HOSPITAL_COMMUNITY)
Admission: RE | Admit: 2017-07-04 | Discharge: 2017-07-04 | Disposition: A | Discharge status: Home / Self Care | Payer: Medicare Other | Source: Ambulatory Visit | Attending: Nurse Practitioner | Admitting: Nurse Practitioner

## 2017-07-04 VITALS — BP 126/82 | HR 74 | Ht 71.0 in | Wt 185.0 lb

## 2017-07-04 DIAGNOSIS — Z7982 Long term (current) use of aspirin: Secondary | ICD-10-CM | POA: Insufficient documentation

## 2017-07-04 DIAGNOSIS — M109 Gout, unspecified: Secondary | ICD-10-CM | POA: Insufficient documentation

## 2017-07-04 DIAGNOSIS — Z7901 Long term (current) use of anticoagulants: Secondary | ICD-10-CM | POA: Insufficient documentation

## 2017-07-04 DIAGNOSIS — Z8719 Personal history of other diseases of the digestive system: Secondary | ICD-10-CM | POA: Diagnosis not present

## 2017-07-04 DIAGNOSIS — I482 Chronic atrial fibrillation: Secondary | ICD-10-CM | POA: Insufficient documentation

## 2017-07-04 DIAGNOSIS — Z79899 Other long term (current) drug therapy: Secondary | ICD-10-CM | POA: Insufficient documentation

## 2017-07-04 DIAGNOSIS — I10 Essential (primary) hypertension: Secondary | ICD-10-CM | POA: Diagnosis present

## 2017-07-04 DIAGNOSIS — I4821 Permanent atrial fibrillation: Secondary | ICD-10-CM

## 2017-07-04 DIAGNOSIS — I129 Hypertensive chronic kidney disease with stage 1 through stage 4 chronic kidney disease, or unspecified chronic kidney disease: Secondary | ICD-10-CM | POA: Insufficient documentation

## 2017-07-04 DIAGNOSIS — I252 Old myocardial infarction: Secondary | ICD-10-CM | POA: Diagnosis not present

## 2017-07-04 DIAGNOSIS — N189 Chronic kidney disease, unspecified: Secondary | ICD-10-CM | POA: Diagnosis not present

## 2017-07-05 DIAGNOSIS — K529 Noninfective gastroenteritis and colitis, unspecified: Secondary | ICD-10-CM | POA: Diagnosis not present

## 2017-07-05 DIAGNOSIS — I4891 Unspecified atrial fibrillation: Secondary | ICD-10-CM | POA: Diagnosis not present

## 2017-07-05 DIAGNOSIS — N189 Chronic kidney disease, unspecified: Secondary | ICD-10-CM | POA: Diagnosis not present

## 2017-07-05 DIAGNOSIS — I129 Hypertensive chronic kidney disease with stage 1 through stage 4 chronic kidney disease, or unspecified chronic kidney disease: Secondary | ICD-10-CM | POA: Diagnosis not present

## 2017-07-05 DIAGNOSIS — D509 Iron deficiency anemia, unspecified: Secondary | ICD-10-CM | POA: Diagnosis not present

## 2017-07-05 DIAGNOSIS — Z48815 Encounter for surgical aftercare following surgery on the digestive system: Secondary | ICD-10-CM | POA: Diagnosis not present

## 2017-07-05 NOTE — Progress Notes (Signed)
Primary Care Physician: Doneen Poisson, MD Referring Physician: [facility] f/u   Hayden Harmon is a 77 y.o. male with a h/o HTN, anemia, that was seen in the ER 3/17 with N/V x 4 days and found to have afib with RVR. He was asked to f/u in the afib clinic. Pt presented  here with afib with rvr of 170+.bpm  He stated that he has not been able to keep any food down x 3 weeks, with intermittent diarrhea and had lost 13 lbs. He lives with his brother who was with him  as well as the sister. He was not able to take any of his meds as he vomited them back up. He was sent to the ER and pt was admitted. He was found to have small bowel obstruction and went on to have surgery .  In the afib clinic, 4/9, he is much improved and reports that he is eating now. He is in afib rate controlled. As he feels better today than the initial visit, he tells me that he believes that he lives in [place] and was dx in [place] several years ago.He just moved here in January of this year. His medical records have been requested by his PCP. He is taking his eliquis bid for stoke prevention with chadsvasc score of at least 3.  F/u 4/30. He continues to gain back his strength. He looks improved. He again states that he was diagnosed with afib at least 10 years ago in the [place] area and has been living in afib ever since then. Recent echo showed normal systolic function, overall structure ok.   Today, he denies symptoms of palpitations, chest pain, shortness of breath, orthopnea, PND, lower extremity edema, dizziness, presyncope, syncope, or neurologic sequela. The patient is tolerating medications without difficulties and is otherwise without complaint today.   Past Medical History:  Diagnosis Date  . A-fib (HCC)   . CKD (chronic kidney disease)   . Essential hypertension 04/10/2017  . Gout   . History of tobacco use   . Iron deficiency anemia   . MI, old 30   Cath/stent recommended, refused PCI   Past Surgical History:    Procedure Laterality Date  . INGUINAL HERNIA REPAIR    . LAPAROTOMY N/A 05/31/2017   Procedure: EXPLORATORY LAPAROTOMY, LYSIS OF ADHESIONS;  Surgeon: Claud Kelp, MD;  Location: [facility] OR;  Service: General;  Laterality: N/A;    Current Outpatient Medications  Medication Sig Dispense Refill  . allopurinol (ZYLOPRIM) 100 MG tablet Take 2 tablets (200 mg total) by mouth daily. 180 tablet 3  . apixaban (ELIQUIS) 5 MG TABS tablet Take 1 tablet (5 mg total) by mouth 2 (two) times daily. 60 tablet 2  . aspirin EC 81 MG tablet Take 81 mg by mouth daily.    Marland Kitchen diltiazem (DILT-XR) 240 MG 24 hr capsule Take 1 capsule (240 mg total) by mouth daily. 90 capsule 3  . ferrous sulfate 325 (65 FE) MG tablet Take 1 tablet (325 mg total) by mouth daily. With food 90 tablet 0  . lisinopril (PRINIVIL,ZESTRIL) 2.5 MG tablet Take 1 tablet (2.5 mg total) by mouth daily. 90 tablet 0   No current facility-administered medications for this encounter.     No Known Allergies  Social History   Socioeconomic History  . Marital status: Legally Separated    Spouse name: Not on file  . Number of children: Not on file  . Years of education: Not on file  .  Highest education level: Not on file  Occupational History  . Not on file  Social Needs  . Financial resource strain: Not on file  . Food insecurity:    Worry: Not on file    Inability: Not on file  . Transportation needs:    Medical: Not on file    Non-medical: Not on file  Tobacco Use  . Smoking status: Never Smoker  . Smokeless tobacco: Never Used  Substance and Sexual Activity  . Alcohol use: Never    Frequency: Never  . Drug use: Never  . Sexual activity: Not Currently  Lifestyle  . Physical activity:    Days per week: Not on file    Minutes per session: Not on file  . Stress: Not on file  Relationships  . Social connections:    Talks on phone: Not on file    Gets together: Not on file    Attends religious service: Not on file    Active  member of club or organization: Not on file    Attends meetings of clubs or organizations: Not on file    Relationship status: Not on file  . Intimate partner violence:    Fear of current or ex partner: Not on file    Emotionally abused: Not on file    Physically abused: Not on file    Forced sexual activity: Not on file  Other Topics Concern  . Not on file  Social History Narrative  . Not on file    No family history on file.  ROS- All systems are reviewed and negative except as per the HPI above  Physical Exam: Vitals:   07/04/17 1518  BP: 126/82  Pulse: 74  SpO2: 100%  Weight: 185 lb (83.9 kg)  Height:  (1.803 m)   Wt Readings from Last 3 Encounters:  07/04/17 185 lb (83.9 kg)  06/13/17 189 lb (85.7 kg)  06/09/17 189 lb 4.8 oz (85.9 kg)    Labs: Lab Results  Component Value Date   NA 137 06/09/2017   K 3.8 06/09/2017   CL 98 06/09/2017   CO2 22 06/09/2017   GLUCOSE 94 06/09/2017   BUN 8 06/09/2017   CREATININE 1.08 06/09/2017   CALCIUM 8.5 (L) 06/09/2017   MG 1.8 06/05/2017   No results found for: INR No results found for: CHOL, HDL, LDLCALC, TRIG   GEN- The patient is well appearing, alert and oriented x 3 today.   Head- normocephalic, atraumatic Eyes-  Sclera clear, conjunctiva pink Ears- hearing intact Oropharynx- clear Neck- supple, no JVP Lymph- no cervical lymphadenopathy Lungs- Clear to ausculation bilaterally, normal work of breathing Heart-  irregular rate and rhythm, no murmurs, rubs or gallops, PMI not laterally displaced GI- soft, NT, ND, + BS Extremities- no clubbing, cyanosis, or edema MS- no significant deformity or atrophy Skin- no rash or lesion Psych- euthymic mood, full affect Neuro- strength and sensation are intact  EKG-not done today Echo- 06/23/2017-Study Conclusions  - Left ventricle: The cavity size was normal. Wall thickness was   increased in a pattern of mild LVH. Systolic function was normal.   The estimated  ejection fraction was in the range of 60% to 65%.   The study is not technically sufficient to allow evaluation of LV   diastolic function. - Aortic valve: Calcified non cornary cusp. There was trivial   regurgitation. - Mitral valve: Calcified annulus. Mildly thickened leaflets . - Left atrium: The atrium was moderately dilated. - Right atrium:  The atrium was mildly dilated.    Assessment and Plan: 1.  Longstanding permanent  Afib Rate controlled He states that PCP requested his medical records form [place] He will continue cardizem 240 mg daily  Continue eliquis 5 mg bid for chadsvasc score of 3  Echo updated and discussed with pt  2. SBO Resolved with surgery Pt has now returned to normal eating Much improved  F/u here in 6 months  Lupita Leash C[facility] [address] [place], [place] [address] [phone]

## 2017-07-07 DIAGNOSIS — I4891 Unspecified atrial fibrillation: Secondary | ICD-10-CM | POA: Diagnosis not present

## 2017-07-07 DIAGNOSIS — N189 Chronic kidney disease, unspecified: Secondary | ICD-10-CM | POA: Diagnosis not present

## 2017-07-07 DIAGNOSIS — I129 Hypertensive chronic kidney disease with stage 1 through stage 4 chronic kidney disease, or unspecified chronic kidney disease: Secondary | ICD-10-CM | POA: Diagnosis not present

## 2017-07-07 DIAGNOSIS — D509 Iron deficiency anemia, unspecified: Secondary | ICD-10-CM | POA: Diagnosis not present

## 2017-07-07 DIAGNOSIS — Z48815 Encounter for surgical aftercare following surgery on the digestive system: Secondary | ICD-10-CM | POA: Diagnosis not present

## 2017-07-07 DIAGNOSIS — K529 Noninfective gastroenteritis and colitis, unspecified: Secondary | ICD-10-CM | POA: Diagnosis not present

## 2017-07-11 DIAGNOSIS — N189 Chronic kidney disease, unspecified: Secondary | ICD-10-CM | POA: Diagnosis not present

## 2017-07-11 DIAGNOSIS — K529 Noninfective gastroenteritis and colitis, unspecified: Secondary | ICD-10-CM | POA: Diagnosis not present

## 2017-07-11 DIAGNOSIS — I4891 Unspecified atrial fibrillation: Secondary | ICD-10-CM | POA: Diagnosis not present

## 2017-07-11 DIAGNOSIS — I129 Hypertensive chronic kidney disease with stage 1 through stage 4 chronic kidney disease, or unspecified chronic kidney disease: Secondary | ICD-10-CM | POA: Diagnosis not present

## 2017-07-11 DIAGNOSIS — D509 Iron deficiency anemia, unspecified: Secondary | ICD-10-CM | POA: Diagnosis not present

## 2017-07-11 DIAGNOSIS — Z48815 Encounter for surgical aftercare following surgery on the digestive system: Secondary | ICD-10-CM | POA: Diagnosis not present

## 2017-07-13 DIAGNOSIS — D509 Iron deficiency anemia, unspecified: Secondary | ICD-10-CM | POA: Diagnosis not present

## 2017-07-13 DIAGNOSIS — I129 Hypertensive chronic kidney disease with stage 1 through stage 4 chronic kidney disease, or unspecified chronic kidney disease: Secondary | ICD-10-CM | POA: Diagnosis not present

## 2017-07-13 DIAGNOSIS — I4891 Unspecified atrial fibrillation: Secondary | ICD-10-CM | POA: Diagnosis not present

## 2017-07-13 DIAGNOSIS — Z48815 Encounter for surgical aftercare following surgery on the digestive system: Secondary | ICD-10-CM | POA: Diagnosis not present

## 2017-07-13 DIAGNOSIS — K529 Noninfective gastroenteritis and colitis, unspecified: Secondary | ICD-10-CM | POA: Diagnosis not present

## 2017-07-13 DIAGNOSIS — N189 Chronic kidney disease, unspecified: Secondary | ICD-10-CM | POA: Diagnosis not present

## 2017-07-14 NOTE — Telephone Encounter (Signed)
HFU appt 06/09/2017 kept with [facility].

## 2017-07-20 DIAGNOSIS — D509 Iron deficiency anemia, unspecified: Secondary | ICD-10-CM | POA: Diagnosis not present

## 2017-07-20 DIAGNOSIS — I129 Hypertensive chronic kidney disease with stage 1 through stage 4 chronic kidney disease, or unspecified chronic kidney disease: Secondary | ICD-10-CM | POA: Diagnosis not present

## 2017-07-20 DIAGNOSIS — I4891 Unspecified atrial fibrillation: Secondary | ICD-10-CM | POA: Diagnosis not present

## 2017-07-20 DIAGNOSIS — N189 Chronic kidney disease, unspecified: Secondary | ICD-10-CM | POA: Diagnosis not present

## 2017-07-20 DIAGNOSIS — K529 Noninfective gastroenteritis and colitis, unspecified: Secondary | ICD-10-CM | POA: Diagnosis not present

## 2017-07-20 DIAGNOSIS — Z48815 Encounter for surgical aftercare following surgery on the digestive system: Secondary | ICD-10-CM | POA: Diagnosis not present

## 2017-07-25 DIAGNOSIS — Z48815 Encounter for surgical aftercare following surgery on the digestive system: Secondary | ICD-10-CM | POA: Diagnosis not present

## 2017-07-25 DIAGNOSIS — N189 Chronic kidney disease, unspecified: Secondary | ICD-10-CM | POA: Diagnosis not present

## 2017-07-25 DIAGNOSIS — I129 Hypertensive chronic kidney disease with stage 1 through stage 4 chronic kidney disease, or unspecified chronic kidney disease: Secondary | ICD-10-CM | POA: Diagnosis not present

## 2017-07-25 DIAGNOSIS — I4891 Unspecified atrial fibrillation: Secondary | ICD-10-CM | POA: Diagnosis not present

## 2017-07-25 DIAGNOSIS — D509 Iron deficiency anemia, unspecified: Secondary | ICD-10-CM | POA: Diagnosis not present

## 2017-07-25 DIAGNOSIS — K529 Noninfective gastroenteritis and colitis, unspecified: Secondary | ICD-10-CM | POA: Diagnosis not present

## 2017-07-28 ENCOUNTER — Encounter: Payer: Medicare (Managed Care) | Admitting: Internal Medicine

## 2017-08-27 ENCOUNTER — Other Ambulatory Visit: Payer: Self-pay | Admitting: Internal Medicine

## 2017-08-27 DIAGNOSIS — I4821 Permanent atrial fibrillation: Secondary | ICD-10-CM

## 2017-08-27 DIAGNOSIS — I4891 Unspecified atrial fibrillation: Secondary | ICD-10-CM

## 2017-09-04 ENCOUNTER — Other Ambulatory Visit: Payer: Self-pay | Admitting: Internal Medicine

## 2017-09-04 DIAGNOSIS — I10 Essential (primary) hypertension: Secondary | ICD-10-CM

## 2017-09-04 DIAGNOSIS — D500 Iron deficiency anemia secondary to blood loss (chronic): Secondary | ICD-10-CM

## 2017-10-24 ENCOUNTER — Other Ambulatory Visit: Payer: Self-pay

## 2017-10-24 ENCOUNTER — Ambulatory Visit (HOSPITAL_COMMUNITY)
Admission: RE | Admit: 2017-10-24 | Discharge: 2017-10-24 | Disposition: A | Discharge status: Home / Self Care | Payer: Medicare Other | Source: Ambulatory Visit | Attending: Internal Medicine | Admitting: Internal Medicine

## 2017-10-24 ENCOUNTER — Encounter: Payer: Self-pay | Admitting: Internal Medicine

## 2017-10-24 ENCOUNTER — Ambulatory Visit (INDEPENDENT_AMBULATORY_CARE_PROVIDER_SITE_OTHER): Payer: Medicare Other | Admitting: Internal Medicine

## 2017-10-24 VITALS — BP 125/70 | HR 70 | Temp 97.6°F | Ht 71.0 in | Wt 196.8 lb

## 2017-10-24 DIAGNOSIS — M1712 Unilateral primary osteoarthritis, left knee: Secondary | ICD-10-CM | POA: Insufficient documentation

## 2017-10-24 DIAGNOSIS — M25561 Pain in right knee: Secondary | ICD-10-CM | POA: Diagnosis not present

## 2017-10-24 DIAGNOSIS — G8929 Other chronic pain: Secondary | ICD-10-CM

## 2017-10-24 DIAGNOSIS — M71321 Other bursal cyst, right elbow: Secondary | ICD-10-CM | POA: Diagnosis not present

## 2017-10-24 DIAGNOSIS — M109 Gout, unspecified: Secondary | ICD-10-CM | POA: Diagnosis not present

## 2017-10-24 DIAGNOSIS — M25861 Other specified joint disorders, right knee: Secondary | ICD-10-CM

## 2017-10-24 DIAGNOSIS — M25461 Effusion, right knee: Secondary | ICD-10-CM | POA: Diagnosis not present

## 2017-10-24 MED ORDER — DICLOFENAC SODIUM 1 % TD GEL: 2.0000 g | Freq: Four times a day (QID) | TRANSDERMAL | 0 refills | Status: DC

## 2017-10-24 NOTE — Progress Notes (Signed)
   CC: Right knee pain  HPI:  Mr.Bowen N Hayden Harmon is a 77 y.o. African-American gentleman with right olecranon bursa and gout presenting with right knee pain.  Right knee pain, unclear etiology: [name] reports of 1 week history of right knee pain.  Rates pain at 1/10 when he sitting but intensifies to 8/10 with standing and ambulation.  The pain is aching quality.  He has a solid 4-5 cm prepatellar solid mass that is not warm to touch, nontender to palpation and not erythematous.  Almost feels calcified in nature.  He states that he has had this mass for the past 10 years and size has remained unchanged.  My current inclination is that given his history of olecranon bursa, he might of had a prepatellar bursa that has now calcified. -Obtain x-ray of right knee -Given prescription for Voltaren gel   Past Medical History:  Diagnosis Date  . A-fib (HCC)   . CKD (chronic kidney disease)   . Essential hypertension 04/10/2017  . Gout   . History of tobacco use   . Iron deficiency anemia   . MI, old 212015   Cath/stent recommended, refused PCI   Review of Systems: As per HPI  Physical Exam:  Vitals:   10/24/17 1547  BP: 125/70  Pulse: 70  Temp: 97.6 F (36.4 C)  TempSrc: Oral  SpO2: 98%  Weight: 196 lb 12.8 oz (89.3 kg)  Height: 5\' 11"  (1.803 m)   Constitutional: In no acute distress Extremity: --> Right elbow olecranon bursa, soft to touch, nonerythematous, nontender to palpation, not warm to touch --> Right knee prepatellar solid mass, nonerythematous, not warm to touch, mildly tender to palpation, minimal effusion.  Assessment & Plan:   See Encounters Tab for problem based charting.  Patient discussed with Dr. Rogelia BogaButcher

## 2017-10-24 NOTE — Patient Instructions (Signed)
Mr. Corine ShelterWatkins,  It was a pleasure taking care of you here at the clinic and sorry to hear about the right knee pain you have been having for the past week.  I am going to get an x-ray of your right knee and also give you some topical pain medications.  I will call you with the results of the x-ray.  ~Dr. Dortha SchwalbeAgyei

## 2017-10-24 NOTE — Assessment & Plan Note (Addendum)
Right knee pain, unclear etiology: [name] reports of 1 week history of right knee pain.  Rates pain at 1/10 when he sitting but intensifies to 8/10 with standing and ambulation.  The pain is aching quality.  He has a solid 4-5 cm prepatellar solid mass that is not warm to touch, nontender to palpation and not erythematous.  Almost feels calcified in nature.  He states that he has had this mass for the past 10 years and size has remained unchanged.  My current inclination is that given his history of olecranon bursa, he might of had a prepatellar bursa that has now calcified. -Obtain x-ray of right knee -Given prescription for Voltaren gel

## 2017-10-26 ENCOUNTER — Telehealth: Payer: Self-pay | Admitting: Internal Medicine

## 2017-10-26 ENCOUNTER — Telehealth: Payer: Self-pay | Admitting: *Deleted

## 2017-10-26 NOTE — Telephone Encounter (Signed)
Information was sent through CoverMyMeds for PA fo Diclofenac Gel.  Approved 03/06/2017 thru 12/31`/2019.   Angelina OkGladys Rehan Holness, RN 10/26/2017 1:43 PM.

## 2017-10-26 NOTE — Telephone Encounter (Signed)
I called patient to discuss his x-ray results with him.  His x-ray of right knee showed "Osteoarthritic changes of all 3 joint compartments manifested by chondrocalcinosis and small spur formation. Mild joint space loss medially. No acute bony abnormality. Small suprapatellar effusion. Moderate amount of prepatellar soft tissue swelling which may reflect bursal inflammation."  I wrote a prescription for diclofenac gel which patient has not picked up.  He wanted me to call his sister to inform her to pick up the medication and I did so rightly.

## 2017-10-27 NOTE — Progress Notes (Signed)
Internal Medicine Clinic Attending  I saw and evaluated the patient.  I personally confirmed the key portions of the history and exam documented by Dr. Agyei and I reviewed pertinent patient test results.  The assessment, diagnosis, and plan were formulated together and I agree with the documentation in the resident's note.  

## 2017-11-18 ENCOUNTER — Other Ambulatory Visit: Payer: Self-pay | Admitting: Internal Medicine

## 2017-11-18 DIAGNOSIS — M25561 Pain in right knee: Principal | ICD-10-CM

## 2017-11-18 DIAGNOSIS — G8929 Other chronic pain: Secondary | ICD-10-CM

## 2017-11-21 NOTE — Telephone Encounter (Signed)
Patient has cancelled previous visits with me and I have never actually met him.  Will ask that he be scheduled for 12/29/17 at 11:15 AM.  Will refill a 1 month supply.  Further refills will require that he be seen in clinic to determine of continued prescriptions are in his best interests medically.

## 2017-12-09 ENCOUNTER — Other Ambulatory Visit: Payer: Self-pay | Admitting: Internal Medicine

## 2017-12-09 DIAGNOSIS — I10 Essential (primary) hypertension: Secondary | ICD-10-CM

## 2017-12-13 ENCOUNTER — Other Ambulatory Visit: Payer: Self-pay | Admitting: Internal Medicine

## 2017-12-13 DIAGNOSIS — I4821 Permanent atrial fibrillation: Secondary | ICD-10-CM

## 2017-12-20 ENCOUNTER — Other Ambulatory Visit: Payer: Self-pay | Admitting: Internal Medicine

## 2017-12-29 ENCOUNTER — Encounter: Payer: Medicare Other | Admitting: Internal Medicine

## 2018-03-11 ENCOUNTER — Other Ambulatory Visit: Payer: Self-pay | Admitting: Internal Medicine

## 2018-03-11 DIAGNOSIS — I4821 Permanent atrial fibrillation: Secondary | ICD-10-CM

## 2018-03-14 ENCOUNTER — Other Ambulatory Visit: Payer: Self-pay | Admitting: Internal Medicine

## 2018-03-14 DIAGNOSIS — I10 Essential (primary) hypertension: Secondary | ICD-10-CM

## 2018-03-14 NOTE — Telephone Encounter (Signed)
Received refill request from pt's pharmacy- pcp authorized 1 month supply of apixaban on 03/12/18 (see note below). Pt needs an appt and has been scheduled for 04/06/2018.  Pharmacy now requesting lisinopril refill.  Will send to pcp for review and 1 month supply if appropriate.  Please advise.Kingsley Spittle Cassady1/8/20209:21 AM   (Noted below copied from 03/11/18 refill encounter) Doneen Poisson, MD      Please schedule patient to see me within the next 1 month at a non-overbook appointment. I have never seen this patient as he continues to cancel appointments with me. I will no longer refill his medications after this 1 month supply unless I am able to assess him and assure he is on the correct regimen. If Friday morning appointments do not fit into his schedule please check with Dr. Rogelia Boga to see if he should be reassigned to a provider that has clinic at a time he can consistently make. Thank You.

## 2018-03-15 NOTE — Telephone Encounter (Signed)
Appt is scheduled for 04/06/2018 at 0945 w/ Dr. Josem Kaufmann.

## 2018-04-04 ENCOUNTER — Other Ambulatory Visit: Payer: Self-pay | Admitting: Internal Medicine

## 2018-04-04 DIAGNOSIS — I4821 Permanent atrial fibrillation: Secondary | ICD-10-CM

## 2018-04-06 ENCOUNTER — Encounter: Payer: Self-pay | Admitting: Internal Medicine

## 2018-04-06 ENCOUNTER — Ambulatory Visit (INDEPENDENT_AMBULATORY_CARE_PROVIDER_SITE_OTHER): Payer: Medicare Other | Admitting: Internal Medicine

## 2018-04-06 VITALS — BP 132/80 | HR 94 | Wt 213.9 lb

## 2018-04-06 DIAGNOSIS — M1A9XX1 Chronic gout, unspecified, with tophus (tophi): Secondary | ICD-10-CM | POA: Diagnosis not present

## 2018-04-06 DIAGNOSIS — N400 Enlarged prostate without lower urinary tract symptoms: Secondary | ICD-10-CM

## 2018-04-06 DIAGNOSIS — D751 Secondary polycythemia: Secondary | ICD-10-CM | POA: Diagnosis not present

## 2018-04-06 DIAGNOSIS — E663 Overweight: Secondary | ICD-10-CM

## 2018-04-06 DIAGNOSIS — E876 Hypokalemia: Secondary | ICD-10-CM

## 2018-04-06 DIAGNOSIS — Z79899 Other long term (current) drug therapy: Secondary | ICD-10-CM

## 2018-04-06 DIAGNOSIS — R351 Nocturia: Secondary | ICD-10-CM

## 2018-04-06 DIAGNOSIS — I723 Aneurysm of iliac artery: Secondary | ICD-10-CM | POA: Diagnosis not present

## 2018-04-06 DIAGNOSIS — I700 Atherosclerosis of aorta: Secondary | ICD-10-CM

## 2018-04-06 DIAGNOSIS — Z23 Encounter for immunization: Secondary | ICD-10-CM

## 2018-04-06 DIAGNOSIS — Z87891 Personal history of nicotine dependence: Secondary | ICD-10-CM | POA: Diagnosis not present

## 2018-04-06 DIAGNOSIS — I10 Essential (primary) hypertension: Secondary | ICD-10-CM | POA: Diagnosis not present

## 2018-04-06 DIAGNOSIS — K5790 Diverticulosis of intestine, part unspecified, without perforation or abscess without bleeding: Secondary | ICD-10-CM

## 2018-04-06 DIAGNOSIS — Z0000 Encounter for general adult medical examination without abnormal findings: Secondary | ICD-10-CM

## 2018-04-06 DIAGNOSIS — R5383 Other fatigue: Secondary | ICD-10-CM

## 2018-04-06 DIAGNOSIS — D509 Iron deficiency anemia, unspecified: Secondary | ICD-10-CM

## 2018-04-06 DIAGNOSIS — Z7982 Long term (current) use of aspirin: Secondary | ICD-10-CM | POA: Diagnosis not present

## 2018-04-06 DIAGNOSIS — N401 Enlarged prostate with lower urinary tract symptoms: Secondary | ICD-10-CM | POA: Diagnosis not present

## 2018-04-06 DIAGNOSIS — I4821 Permanent atrial fibrillation: Secondary | ICD-10-CM

## 2018-04-06 DIAGNOSIS — M11861 Other specified crystal arthropathies, right knee: Secondary | ICD-10-CM

## 2018-04-06 DIAGNOSIS — Z7901 Long term (current) use of anticoagulants: Secondary | ICD-10-CM | POA: Diagnosis not present

## 2018-04-06 DIAGNOSIS — R5382 Chronic fatigue, unspecified: Secondary | ICD-10-CM

## 2018-04-06 HISTORY — DX: Diverticulosis of intestine, part unspecified, without perforation or abscess without bleeding: K57.90

## 2018-04-06 HISTORY — DX: Aneurysm of iliac artery: I72.3

## 2018-04-06 HISTORY — DX: Other specified crystal arthropathies, right knee: M11.861

## 2018-04-06 HISTORY — DX: Overweight: E66.3

## 2018-04-06 MED ORDER — MULTI-VITAMIN/MINERALS PO TABS: 1.0000 | ORAL_TABLET | Freq: Every day | ORAL | 3 refills | Status: AC

## 2018-04-06 MED ORDER — LISINOPRIL 2.5 MG PO TABS: 2.5000 mg | ORAL_TABLET | Freq: Every day | ORAL | 3 refills | Status: AC

## 2018-04-06 NOTE — Assessment & Plan Note (Signed)
Assessment  His left iliac artery aneurysm was found on a CT scan of the abdomen and pelvis obtained for other reasons.  His aneurysm remains asymptomatic.  His blood pressure is reasonably well controlled and he is not yet on statin therapy.  Plan  At the follow-up visit we will order an ultrasound to follow-up on the size of the left iliac aneurysm and make sure it is not growing or above 3.5 cm.  If it enlarges to above 3.5 cm or becomes symptomatic we will refer him to vascular surgery to consider endovascular stenting.  We are continuing aggressive management of his hypertension with the diltiazem and lisinopril at their current doses.  At the follow-up visit I will stress the importance of starting a high intensity statin for both his asymptomatic aortic atherosclerotic disease and asymptomatic left iliac aneurysm.

## 2018-04-06 NOTE — Assessment & Plan Note (Signed)
Assessment  He currently does not have claudication associated with his aortic atherosclerosis.  His blood pressure is reasonably well controlled on the current regimen.  Today he wanted to limit his medications to those that were absolutely necessary.  Plan  We will continue risk factor modification with the lisinopril and the diltiazem to control the blood pressure.  At the follow-up visit I will discuss the importance of a high intensity statin given his aortic atherosclerotic disease even though it is asymptomatic.  Another reason for the importance of beginning a statin is his left iliac artery aneurysm which also requires aggressive vascular risk factor modification.

## 2018-04-06 NOTE — Assessment & Plan Note (Signed)
Assessment  He did not tolerate the ferrous sulfate 325 mg by mouth daily secondary to GI upset and constipation.  He therefore discontinued this on his own.  A ferritin and CBC were drawn today and are pending at the time of this dictation.  Plan  We will follow-up on the results of the CBC and the ferritin.  If he remains with an iron deficiency anemia we will consider offering him an alternative oral iron supplementation formulation.  We discussed the concern for possible colon cancer, especially given a brother who died of colon cancer, in the setting of this iron deficiency anemia.  He is currently not interested in having a colonoscopy "anytime soon".  He is willing to submit stool cards for blood at the follow-up visit realizing this is less than ideal but more tolerable to him.

## 2018-04-06 NOTE — Assessment & Plan Note (Signed)
Assessment  He carries a clinical diagnosis of tophaceous gout and is currently on allopurinol 200 mg by mouth daily.  His last uric acid was 6.5 on what I believe was an allopurinol dose of 100 mg by mouth daily and it had been increased after that result.  A uric acid was obtained today and is pending at the time of this dictation.  That said, given his presumed tophi he would benefit from a lower target of approximately 4.  Plan  We are continuing the allopurinol at 200 mg by mouth daily while we await for the resulting of the uric acid obtained today.  If it is above 4 we will consider increasing the allopurinol to 300 mg by mouth daily and reassessing the uric acid with a target of less than 4 in hopes of resorbing the tophi.

## 2018-04-06 NOTE — Assessment & Plan Note (Signed)
He was given the flu vaccination today.  At the follow-up visit we will either initiate the Pneumovax series or provide him with a tetanus-diphtheria shot which he is also due for.  He is otherwise up-to-date on his routine healthcare maintenance.

## 2018-04-06 NOTE — Patient Instructions (Addendum)
It was nice to meet you.  I look forward to taking care of you for years to come.  1) Keep taking the medications as you are.  2) We gave you the flu shot today.  3) I started a multivitamin for you.  Take 1 tablet daily.  4) I renewed the lisinopril.  5) I drew some labs on you.  I will call you if there is anything to worry about.  I will see you back in 6 months, sooner if necessary.

## 2018-04-06 NOTE — Assessment & Plan Note (Signed)
Assessment  His physical exam is consistent with his diagnosis of permanent atrial fibrillation.  It is otherwise asymptomatic.  His CHA2DS2-VASc score is 4 giving him an adjusted stroke rate of 4 %/year.  He is tolerating the diltiazem XR 240 mg by mouth daily for rate control and the Eliquis at 5 mg by mouth twice daily without overt evidence of bleeding.  Plan  We will continue the diltiazem XR 240 mg by mouth daily for rate control and Eliquis 5 mg by mouth twice daily for CVA prophylaxis given his 4% risk per year.  He currently takes an aspirin a day at 81 mg by mouth daily.  At the follow-up visit we will discuss stopping this medication and replacing it with a high intensity statin for which I believe he will receive greater benefit because of his other vascular disease.

## 2018-04-06 NOTE — Progress Notes (Signed)
   Subjective:    Patient ID: Hayden Harmon, male    DOB: 1940/05/08, 78 y.o.   MRN: [id number]  HPI  Hayden Harmon is here for follow-up of his essential hypertension, aortic atherosclerosis, aneurysm of the left iliac artery, permanent atrial fibrillation, tophaceous gout, and iron deficiency anemia. Please see the A&P for the status of the pt's chronic medical problems.  Review of Systems  Constitutional: Positive for fatigue. Negative for activity change, appetite change and unexpected weight change.  Respiratory: Negative for cough, chest tightness, shortness of breath and wheezing.   Cardiovascular: Negative for chest pain, palpitations and leg swelling.  Gastrointestinal: Negative for abdominal pain, blood in stool, constipation, diarrhea, nausea and vomiting.  Endocrine: Negative for cold intolerance and polyuria.  Genitourinary: Positive for frequency. Negative for difficulty urinating and urgency.       Nocturia X 2-3  Musculoskeletal: Positive for arthralgias. Negative for joint swelling and myalgias.  Skin: Negative for rash.  Allergic/Immunologic: Negative for environmental allergies.  Neurological: Negative for seizures and headaches.  Psychiatric/Behavioral: Negative for dysphoric mood. The patient is not nervous/anxious.       Objective:   Physical Exam Vitals signs and nursing note reviewed.  Constitutional:      General: He is not in acute distress.    Appearance: Normal appearance. He is not ill-appearing, toxic-appearing or diaphoretic.  HENT:     Head: Normocephalic and atraumatic.  Eyes:     General: No scleral icterus.       Right eye: No discharge.        Left eye: No discharge.  Cardiovascular:     Rate and Rhythm: Normal rate. Rhythm irregular.     Heart sounds: Normal heart sounds. No murmur. No friction rub. No gallop.   Pulmonary:     Effort: Pulmonary effort is normal. No respiratory distress.     Breath sounds: Normal breath sounds. No stridor.  No wheezing, rhonchi or rales.  Abdominal:     General: Bowel sounds are normal. There is no distension.     Palpations: Abdomen is soft.     Tenderness: There is no abdominal tenderness. There is no guarding or rebound.  Musculoskeletal: Normal range of motion.        General: Deformity present. No swelling or tenderness.     Right lower leg: No edema.     Left lower leg: No edema.     Comments: Presumed tophi over the right elbow and knee  Skin:    General: Skin is warm and dry.     Coloration: Skin is not jaundiced.     Findings: No bruising or rash.  Neurological:     Mental Status: He is alert and oriented to person, place, and time.     Motor: No weakness.     Coordination: Coordination normal.  Psychiatric:        Mood and Affect: Mood normal.        Behavior: Behavior normal.        Thought Content: Thought content normal.        Judgment: Judgment normal.       Assessment & Plan:   Please see problem oriented charting.

## 2018-04-06 NOTE — Assessment & Plan Note (Signed)
Assessment  His blood pressure today is 132/80.  This is just over his target of 130/80.  His current antihypertensive regimen includes lisinopril 2.5 mg by mouth daily and diltiazem XR 240 mg by mouth daily.  He is tolerating this regimen well.  Plan  We will continue the lisinopril 2.5 mg by mouth daily and diltiazem XR 240 mg by mouth daily and reassess the blood pressure at the follow-up visit.  A basic metabolic panel was obtained today and is pending at the time of this dictation.  As he does have some symptoms of nocturia in the future we may consider changing out the lisinopril for an alpha-blocker such as doxazosin.  This will be discussed at the follow-up visit.

## 2018-04-06 NOTE — Assessment & Plan Note (Signed)
Assessment  He notes nocturia x 2-3.  We discussed the possibility of changing his blood pressure medication to an alpha-blocker which may help with this.  Together  we decided to defer any decision on making this change until the follow-up visit.  Plan  We will reassess his nocturia at the follow-up visit and consider switching out the lisinopril to an alpha-blocker in hopes of improving his nocturia.

## 2018-04-07 LAB — CBC
HEMATOCRIT: 54 % — AB (ref 37.5–51.0)
HEMOGLOBIN: 17 g/dL (ref 13.0–17.7)
MCH: 23.4 pg — AB (ref 26.6–33.0)
MCHC: 31.5 g/dL (ref 31.5–35.7)
MCV: 74 fL — AB (ref 79–97)
PLATELETS: 345 10*3/uL (ref 150–450)
RBC: 7.28 x10E6/uL — AB (ref 4.14–5.80)
RDW: 20.6 % — ABNORMAL HIGH (ref 11.6–15.4)
WBC: 5.8 10*3/uL (ref 3.4–10.8)

## 2018-04-07 LAB — BMP8+ANION GAP
ANION GAP: 19 mmol/L — AB (ref 10.0–18.0)
BUN/Creatinine Ratio: 13 (ref 10–24)
BUN: 18 mg/dL (ref 8–27)
CO2: 25 mmol/L (ref 20–29)
CREATININE: 1.35 mg/dL — AB (ref 0.76–1.27)
Calcium: 9.3 mg/dL (ref 8.6–10.2)
Chloride: 101 mmol/L (ref 96–106)
GFR calc Af Amer: 57 mL/min/{1.73_m2} — ABNORMAL LOW (ref >59)
GFR, EST NON AFRICAN AMERICAN: 50 mL/min/{1.73_m2} — AB (ref >59)
Glucose: 91 mg/dL (ref 65–99)
Potassium: 3.1 mmol/L — ABNORMAL LOW (ref 3.5–5.2)
SODIUM: 145 mmol/L — AB (ref 134–144)

## 2018-04-07 LAB — URIC ACID: Uric Acid: 7.3 mg/dL (ref 3.7–8.6)

## 2018-04-07 LAB — FERRITIN: Ferritin: 31 ng/mL (ref 30–400)

## 2018-04-07 LAB — TSH: TSH: 3.53 u[IU]/mL (ref 0.450–4.500)

## 2018-04-10 MED ORDER — POTASSIUM CHLORIDE ER 10 MEQ PO TBCR: 20.0000 meq | EXTENDED_RELEASE_TABLET | Freq: Every day | ORAL | 30 refills | Status: AC

## 2018-04-10 MED ORDER — ATORVASTATIN CALCIUM 40 MG PO TABS: 40.0000 mg | ORAL_TABLET | Freq: Every day | ORAL | 3 refills | Status: AC

## 2018-04-10 MED ORDER — ALLOPURINOL 300 MG PO TABS: 300.0000 mg | ORAL_TABLET | Freq: Every day | ORAL | 3 refills | Status: AC

## 2018-04-10 NOTE — Addendum Note (Signed)
Addended by: Doneen Poisson D on: 04/10/2018 03:38 PM   Modules accepted: Orders

## 2018-04-10 NOTE — Progress Notes (Signed)
Patient ID: Hayden Harmon, male   DOB: Jul 13, 1940, 78 y.o.   MRN: [id number]  Upon further review I believe he has a secondary polycythemia, but I am unable to attribute it obviously to hypoxic cardiopulmonary disease or OSA.  He will present tot he lab tomorrow for further evaluation of his polycythemia and provide a urine for a urinalysis, repeat CBC, this time with differential, comprehensive medical panel to assess the liver, and an EPO level.  Further evaluation and therapy are pending the results of these tests.

## 2018-04-10 NOTE — Addendum Note (Signed)
Addended by: Doneen Poisson D on: 04/10/2018 04:21 PM   Modules accepted: Orders

## 2018-04-10 NOTE — Progress Notes (Signed)
Patient ID: Hayden Harmon, male   DOB: [birth year], 78 y.o.   MRN: [id number]  Ferritin 31  Technically not in the iron deficient range, but very borderline.  Given elevated Hct will hold off on trying an alternative iron preparation since he is not interested in a colonoscopy at this time.  TSH 3.530  Within normal limits.  Not the cause of his fatigue.  Uric acid 7.3  Too high on the allopurinol 200 mg daily, especially given my concern for tophaceous gout and the need to lower the uric acid level to < 4.0 to enhance resorption of the tophi.  I have increased the allopurinol to 300 mg daily and will reassess the uric acid level at follow-up.  BMP: Na 145, K 3.1, BUN 18, Cr 1.35, Ca 9.3, eGFR 57  Hypokalemia.  Have prescribed KCl 20 mg PO QD.  Will reassess potassium level and renal function at the follow-up visit with a repeat BMP.  CBC: WBC 5.8, Hgb 17.0, Hct 54.0, Plts 345, MCV 74  Polycythemia with improvement in iron stores.  I am unable to pin down an obvious secondary source via history.  Will read up on the next steps in the evaluation.  I called his sister Parke Simmers) at his request to discuss the above lab results.  I also mentioned to her the medication additions and changes I was making and recommended him starting a statin for his peripheral vascular disease, for which she was supportive.  I also mentioned his iliac aneurysm and my recommendation to re-image with ultrasound to assure stability.  She was supportive of getting this study and the request was entered.

## 2018-04-11 ENCOUNTER — Telehealth: Payer: Self-pay | Admitting: *Deleted

## 2018-04-11 ENCOUNTER — Other Ambulatory Visit (INDEPENDENT_AMBULATORY_CARE_PROVIDER_SITE_OTHER): Payer: Medicare Other

## 2018-04-11 DIAGNOSIS — I723 Aneurysm of iliac artery: Secondary | ICD-10-CM

## 2018-04-11 DIAGNOSIS — D751 Secondary polycythemia: Secondary | ICD-10-CM | POA: Diagnosis not present

## 2018-04-11 NOTE — Telephone Encounter (Signed)
Order signed. Thank you!

## 2018-04-11 NOTE — Telephone Encounter (Signed)
Received call from [phone] [facility])-requesting current order for VAS US LOWER EXT ARTERIAL PSEUDOANEURYSM COMPRESSION  Be changed to VAS US LOWER EXT DUPLEX. Vascular lab aware of reason for exam (3.4 cm left iliac artery aneurysm found on CT pelvis for other indications.  Has this aneurysm increased in size since the last study.)   Will f/u with ordering MD to change order if appropriate.Kingsley SpittleGoldston, Denaja Verhoeven Cassady2/5/20204:08 PM

## 2018-04-12 LAB — CMP14 + ANION GAP
ALT: 8 IU/L (ref 0–44)
ANION GAP: 19 mmol/L — AB (ref 10.0–18.0)
AST: 15 IU/L (ref 0–40)
Albumin/Globulin Ratio: 1.6 (ref 1.2–2.2)
Albumin: 4.5 g/dL (ref 3.7–4.7)
Alkaline Phosphatase: 83 IU/L (ref 39–117)
BUN/Creatinine Ratio: 10 (ref 10–24)
BUN: 12 mg/dL (ref 8–27)
Bilirubin Total: 0.5 mg/dL (ref 0.0–1.2)
CO2: 25 mmol/L (ref 20–29)
CREATININE: 1.18 mg/dL (ref 0.76–1.27)
Calcium: 9.2 mg/dL (ref 8.6–10.2)
Chloride: 96 mmol/L (ref 96–106)
GFR calc Af Amer: 67 mL/min/{1.73_m2} (ref >59)
GFR, EST NON AFRICAN AMERICAN: 58 mL/min/{1.73_m2} — AB (ref >59)
Globulin, Total: 2.9 g/dL (ref 1.5–4.5)
Glucose: 136 mg/dL — ABNORMAL HIGH (ref 65–99)
Potassium: 3.1 mmol/L — ABNORMAL LOW (ref 3.5–5.2)
Sodium: 140 mmol/L (ref 134–144)
TOTAL PROTEIN: 7.4 g/dL (ref 6.0–8.5)

## 2018-04-12 LAB — CBC WITH DIFFERENTIAL/PLATELET
BASOS: 1 %
Basophils Absolute: 0 10*3/uL (ref 0.0–0.2)
EOS (ABSOLUTE): 0.1 10*3/uL (ref 0.0–0.4)
Eos: 3 %
HEMATOCRIT: 53.5 % — AB (ref 37.5–51.0)
Hemoglobin: 16.6 g/dL (ref 13.0–17.7)
Immature Grans (Abs): 0 10*3/uL (ref 0.0–0.1)
Immature Granulocytes: 0 %
Lymphocytes Absolute: 1.7 10*3/uL (ref 0.7–3.1)
Lymphs: 30 %
MCH: 23 pg — ABNORMAL LOW (ref 26.6–33.0)
MCHC: 31 g/dL — ABNORMAL LOW (ref 31.5–35.7)
MCV: 74 fL — ABNORMAL LOW (ref 79–97)
Monocytes Absolute: 0.5 10*3/uL (ref 0.1–0.9)
Monocytes: 9 %
Neutrophils Absolute: 3.2 10*3/uL (ref 1.4–7.0)
Neutrophils: 57 %
Platelets: 321 10*3/uL (ref 150–450)
RBC: 7.23 x10E6/uL (ref 4.14–5.80)
RDW: 20.5 % — ABNORMAL HIGH (ref 11.6–15.4)
WBC: 5.7 10*3/uL (ref 3.4–10.8)

## 2018-04-12 LAB — URINALYSIS, COMPLETE
Bilirubin, UA: NEGATIVE
Glucose, UA: NEGATIVE
Ketones, UA: NEGATIVE
Nitrite, UA: NEGATIVE
PH UA: 6 (ref 5.0–7.5)
RBC, UA: NEGATIVE
Specific Gravity, UA: 1.014 (ref 1.005–1.030)
Urobilinogen, Ur: 0.2 mg/dL (ref 0.2–1.0)

## 2018-04-12 LAB — MICROSCOPIC EXAMINATION
Casts: NONE SEEN /lpf
WBC, UA: >30 /hpf — AB (ref 0–5)

## 2018-04-12 LAB — ERYTHROPOIETIN: Erythropoietin: 68.6 m[IU]/mL — ABNORMAL HIGH (ref 2.6–18.5)

## 2018-04-16 ENCOUNTER — Ambulatory Visit (HOSPITAL_COMMUNITY)
Admission: RE | Admit: 2018-04-16 | Discharge: 2018-04-16 | Disposition: A | Discharge status: Home / Self Care | Payer: Medicare Other | Source: Ambulatory Visit | Attending: Internal Medicine | Admitting: Internal Medicine

## 2018-04-16 ENCOUNTER — Other Ambulatory Visit: Payer: Self-pay | Admitting: Internal Medicine

## 2018-04-16 DIAGNOSIS — I723 Aneurysm of iliac artery: Secondary | ICD-10-CM

## 2018-04-16 NOTE — Addendum Note (Signed)
Addended by: Doneen Poisson D on: 04/16/2018 02:14 PM   Modules accepted: Orders

## 2018-04-16 NOTE — Progress Notes (Signed)
CBC/BMP unchanged from last week  LFTs: unremarkable  Ferritin: 31, likely still iron deficient.  Will rediscuss colonoscopy at next visit  Urinalysis: Trace leukocytes, 1+ protein, 02- RBC/HPF, > 30 WBC/HPF  Erythropoietin 68.6 (high)  This suggests that he has secondary polycythemia.  I called his sister (with his permission) and discussed these results and my concerns.  Of note, in clinic his pulse ox on RA was 98%.  This makes me concerned that any hypoxemia he is experiencing may be intermittent, such as nocturnal from OSA.  That said, his sister notes that he does snore, but she has never seen him stop breathing, nor has he been excessively somnolent or fall asleep at inappropriate times.  She confirms his history that he no longer smokes, and states he is not knowingly exposed to spacer heaters with regards to possible carbon monoxide poisoning.  At this point, we will obtain PFTs with an ABG, and thus carboxyhemoglobin measurement.  If unremarkable, we will consider either an overnight pulse oximetry screen or polysomnography.

## 2018-04-30 ENCOUNTER — Ambulatory Visit (HOSPITAL_COMMUNITY)
Admission: RE | Admit: 2018-04-30 | Discharge: 2018-04-30 | Disposition: A | Discharge status: Home / Self Care | Payer: Medicare Other | Source: Ambulatory Visit | Attending: Internal Medicine | Admitting: Internal Medicine

## 2018-04-30 DIAGNOSIS — Z87891 Personal history of nicotine dependence: Secondary | ICD-10-CM | POA: Insufficient documentation

## 2018-04-30 DIAGNOSIS — D751 Secondary polycythemia: Secondary | ICD-10-CM | POA: Insufficient documentation

## 2018-04-30 DIAGNOSIS — R942 Abnormal results of pulmonary function studies: Secondary | ICD-10-CM | POA: Diagnosis not present

## 2018-04-30 LAB — BLOOD GAS, ARTERIAL
Acid-Base Excess: 1.5 mmol/L (ref 0.0–2.0)
Bicarbonate: 25.3 mmol/L (ref 20.0–28.0)
Drawn by: 205171
FIO2: 21
O2 Saturation: 93.1 %
Patient temperature: 98.6
pCO2 arterial: 38.4 mmHg (ref 32.0–48.0)
pH, Arterial: 7.435 (ref 7.350–7.450)
pO2, Arterial: 75.7 mmHg — ABNORMAL LOW (ref 83.0–108.0)

## 2018-04-30 LAB — PULMONARY FUNCTION TEST
DL/VA % pred: 110 %
DL/VA: 4.3 ml/min/mmHg/L
DLCO cor % pred: 74 %
DLCO cor: 18.96 ml/min/mmHg
DLCO unc % pred: 80 %
DLCO unc: 20.39 ml/min/mmHg
FEF 25-75 Post: 3.13 L/sec
FEF 25-75 Pre: 2.87 L/sec
FEF2575-%Change-Post: 9 %
FEF2575-%Pred-Post: 142 %
FEF2575-%Pred-Pre: 130 %
FEV1-%Change-Post: 0 %
FEV1-%Pred-Post: 97 %
FEV1-%Pred-Pre: 97 %
FEV1-Post: 2.75 L
FEV1-Pre: 2.75 L
FEV1FVC-%Change-Post: 0 %
FEV1FVC-%Pred-Pre: 111 %
FEV6-%Change-Post: 0 %
FEV6-%Pred-Post: 91 %
FEV6-%Pred-Pre: 91 %
FEV6-Post: 3.3 L
FEV6-Pre: 3.3 L
FEV6FVC-%Change-Post: 0 %
FEV6FVC-%Pred-Post: 105 %
FEV6FVC-%Pred-Pre: 105 %
FVC-%Change-Post: 0 %
FVC-%PRED-POST: 86 %
FVC-%Pred-Pre: 86 %
FVC-Post: 3.32 L
FVC-Pre: 3.3 L
POST FEV6/FVC RATIO: 99 %
PRE FEV1/FVC RATIO: 83 %
Post FEV1/FVC ratio: 83 %
Pre FEV6/FVC Ratio: 100 %
RV % pred: 60 %
RV: 1.64 L
TLC % pred: 69 %
TLC: 5.07 L

## 2018-04-30 MED ORDER — ALBUTEROL SULFATE (2.5 MG/3ML) 0.083% IN NEBU
2.5000 mg | INHALATION_SOLUTION | Freq: Once | RESPIRATORY_TRACT | Status: AC
  Administered 2018-04-30: 2.5 mg via RESPIRATORY_TRACT

## 2018-05-03 ENCOUNTER — Other Ambulatory Visit: Payer: Self-pay | Admitting: Internal Medicine

## 2018-05-03 DIAGNOSIS — I4821 Permanent atrial fibrillation: Secondary | ICD-10-CM

## 2018-05-26 ENCOUNTER — Other Ambulatory Visit: Payer: Self-pay | Admitting: Internal Medicine

## 2018-05-26 DIAGNOSIS — I10 Essential (primary) hypertension: Secondary | ICD-10-CM

## 2018-06-04 ENCOUNTER — Encounter: Payer: Self-pay | Admitting: *Deleted

## 2018-09-29 IMAGING — CT CT ABD-PELV W/O CM
2 of 4 series · 15 of 46 positions shown, 17 images · non-contrast
Comparison: CT Abdomen and Pelvis 05/25/2017.

CLINICAL DATA: 76-year-old male with nausea and vomiting.

EXAM:
CT ABDOMEN AND PELVIS WITHOUT CONTRAST
TECHNIQUE: Multidetector CT imaging of the abdomen and pelvis was performed
following the standard protocol without IV contrast.

[Series 3: a/p w/o 5mm · axial · non-contrast · 0.83mm/px · z∈[+1027,+1487]mm · 12 of 106 slices shown, 14 images]
[im 9/106  soft-tissue]
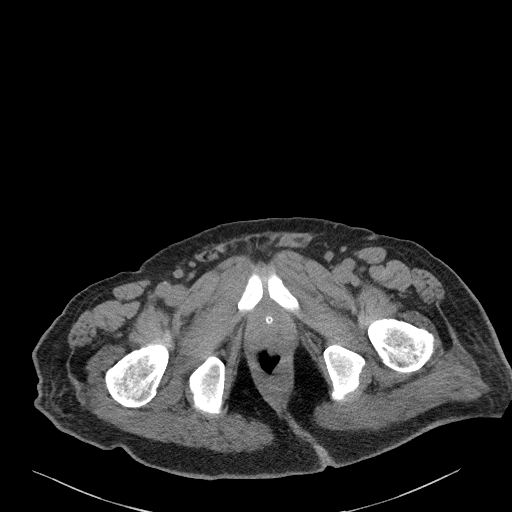
[im 9/106  bone]
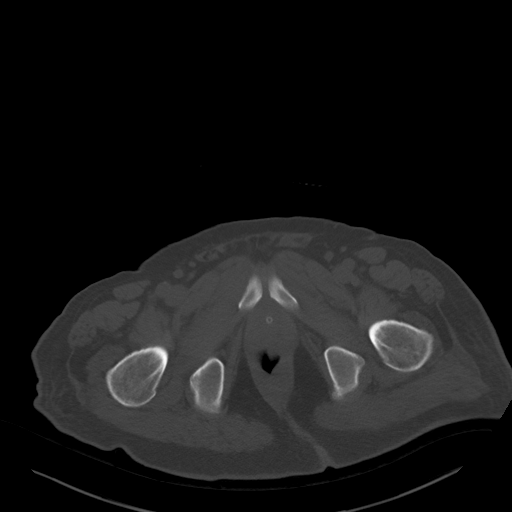
[im 17/106  soft-tissue]
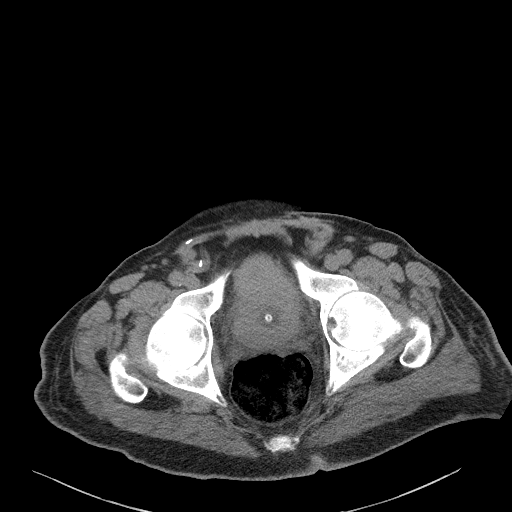
[im 26/106  soft-tissue]
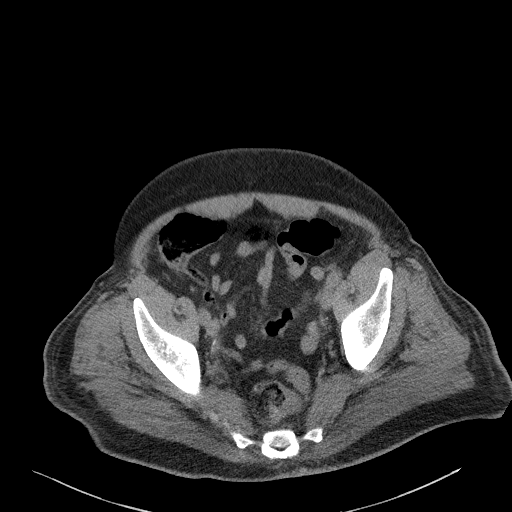
[im 34/106  soft-tissue]
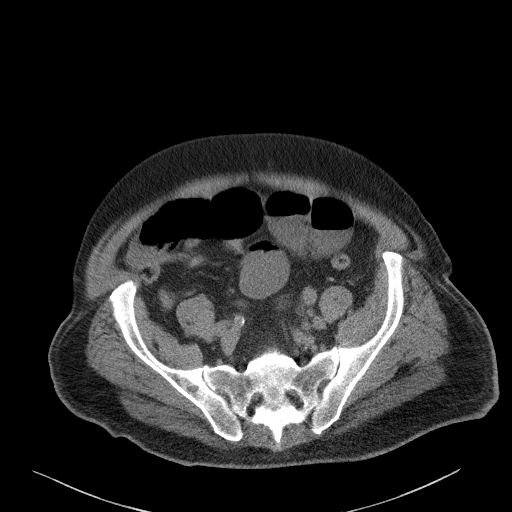
[im 43/106  soft-tissue]
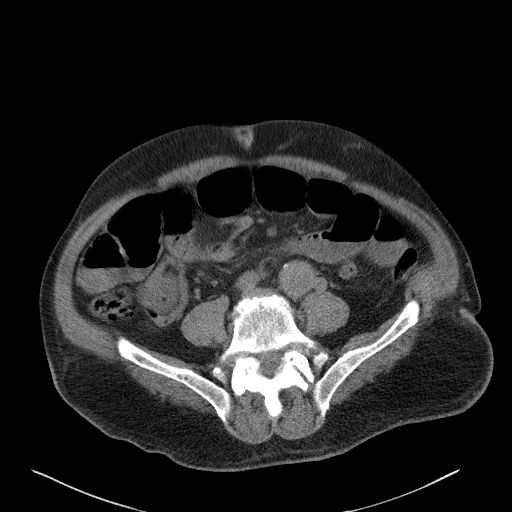
[im 51/106  soft-tissue]
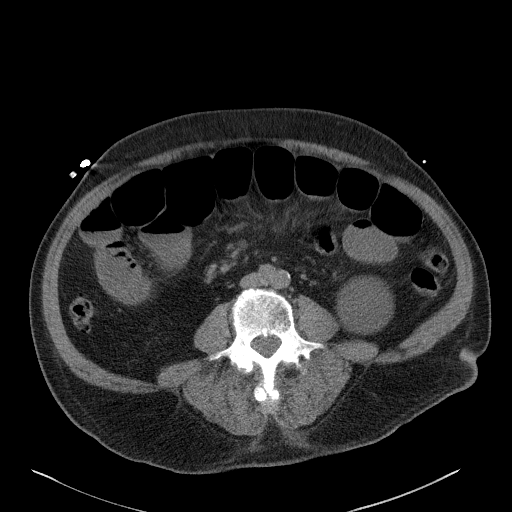
[im 59/106  soft-tissue]
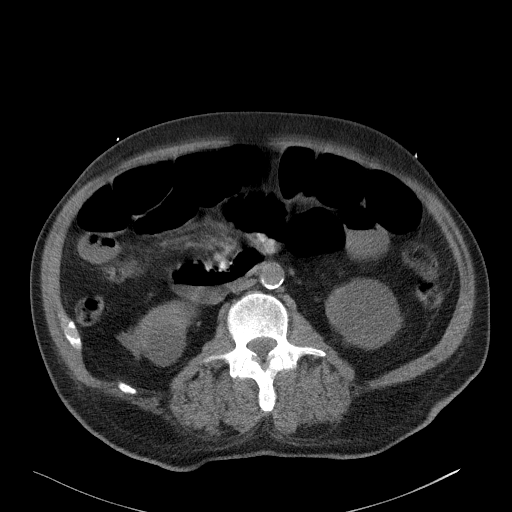
[im 68/106  soft-tissue]
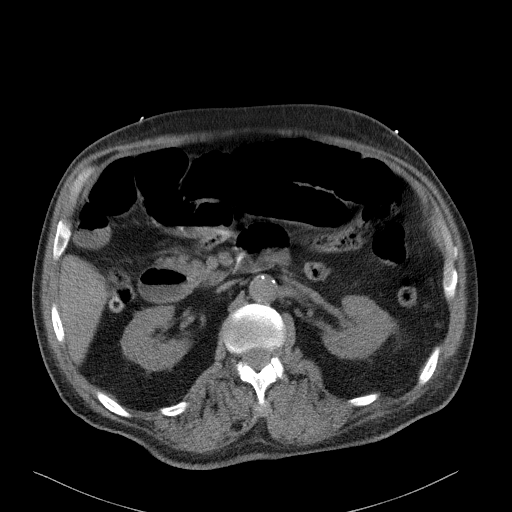
[im 76/106  soft-tissue]
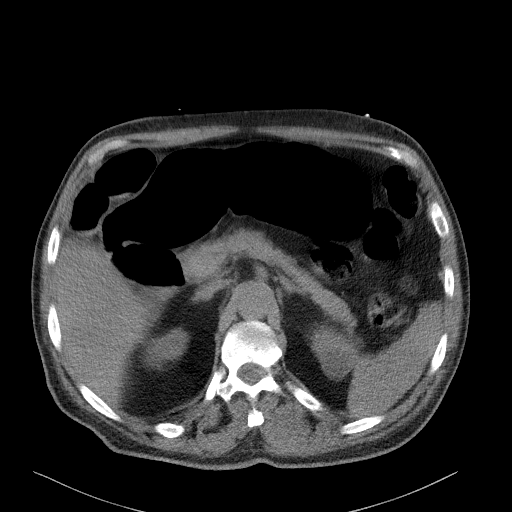
[im 76/106  bone]
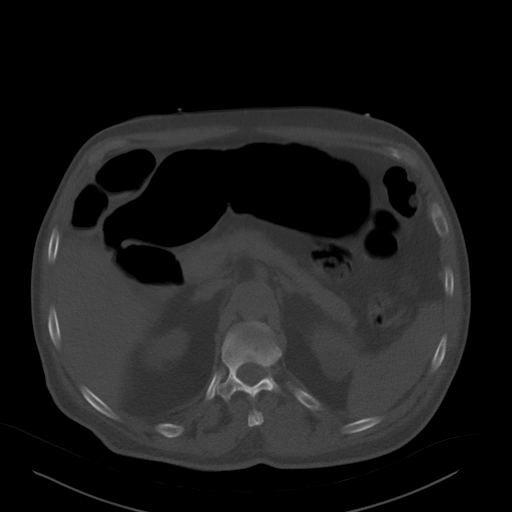
[im 85/106  soft-tissue]
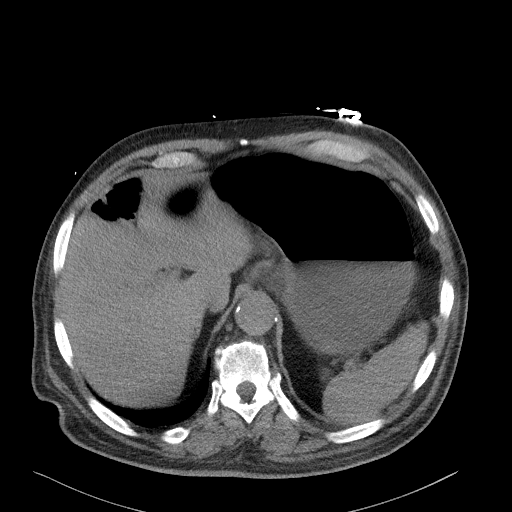
[im 93/106  soft-tissue]
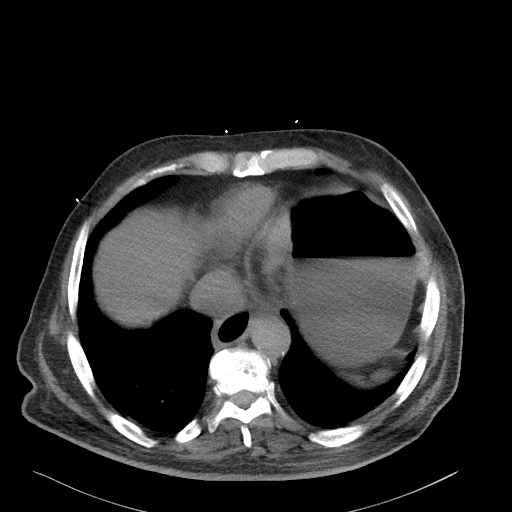
[im 101/106  soft-tissue]
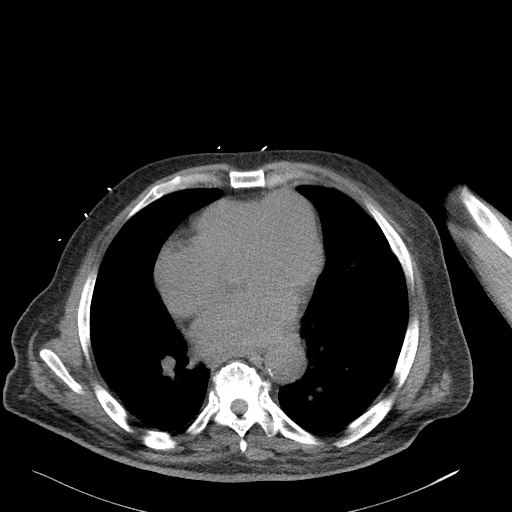

[Series 6: a/p w/o cor · coronal · non-contrast · 0.76mm/px · 3 of 163 slices shown]
[im 55/163  soft-tissue]
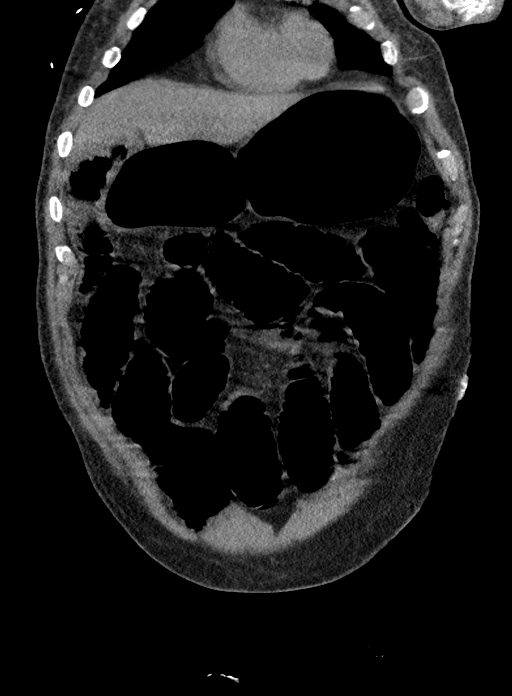
[im 73/163  soft-tissue]
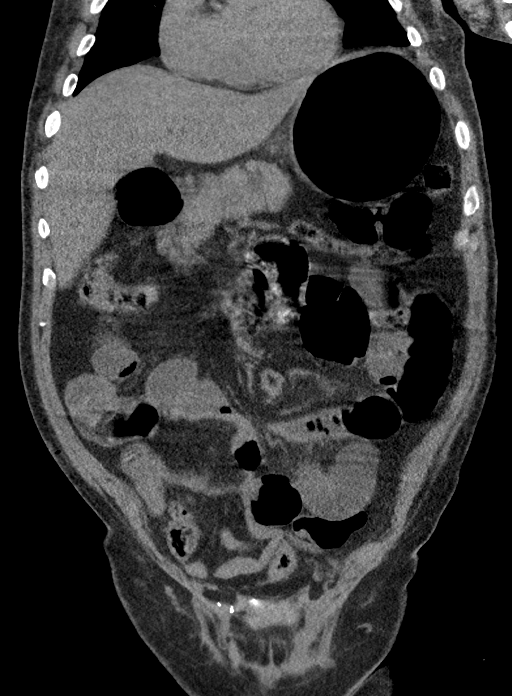
[im 91/163  soft-tissue]
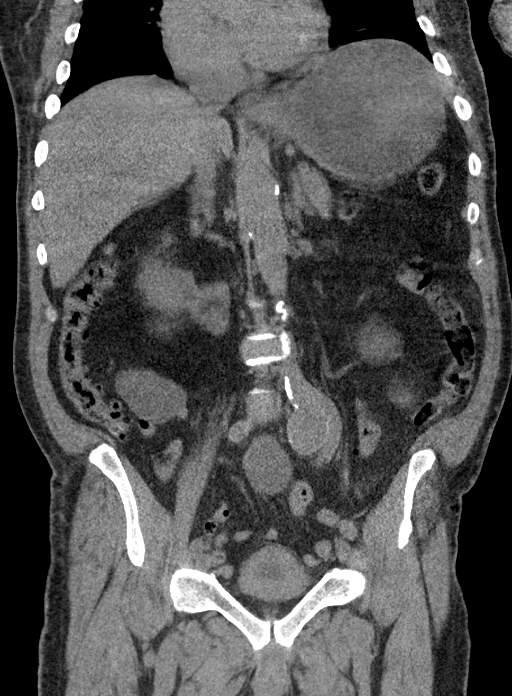

[15 of 46 positions shown; findings below may reference images not displayed]

FINDINGS: Lower chest: Respiratory motion artifact at the lung bases today. No
pericardial or pleural effusion. Mild dependent atelectasis.

Hepatobiliary: Negative noncontrast liver. The gallbladder is
diminutive or absent.

Pancreas: Negative.

Spleen: Negative.

Adrenals/Urinary Tract: Normal adrenal glands. Stable bilateral
kidneys, multiple simple fluid density renal cysts. Decompressed
ureters.

Urinary bladder today is decompressed by Foley catheter. There is
mild residual bladder wall thickening.

Stomach/Bowel: Retained stool in the rectum similar to the prior
study. Intermittent decompressed sigmoid colon. Redundant proximal
sigmoid colon, descending colon, and splenic flexure. Decompressed
transverse colon. Diverticulosis at the hepatic flexure and in the
right colon but no active inflammation identified. Negative cecum
and appendix.

Decompressed terminal ileum and small bowel loops in the pelvis and
right lower quadrant.

Severe air and fluid distension of the stomach. Similar distension
of the distal esophagus. Moderately distended duodenum and small
bowel loops throughout the abdomen with an abrupt in the central
abdomen near the level of the umbilicus. See series 3 images 65
through 72.

No abdominal free air or free fluid.

Vascular/Lymphatic: Aortoiliac calcified atherosclerosis. Vascular
patency is not evaluated in the absence of IV contrast. Left iliac
artery aneurysm up to 34 millimeters diameter redemonstrated.

No lymphadenopathy.

Reproductive: Foley catheter in place. Prostate hypertrophy
redemonstrated and stable. Sequelae of right inguinal hernia repair
with small surgical clips.

Other: No pelvic free fluid.

Multiple probable small ventral abdominal wall subcutaneous
injection sites, such as on series 3, image 63.

Musculoskeletal: Heterogeneous bone mineralization in the lower
thoracic spine, although many of the lucent thoracic vertebral areas
appear to be endplate Schmorl's nodes. Advanced lower lumbar facet
degeneration. Mildly heterogeneous sclerotic bone mineralization in
the sacrum and pelvis. Degenerative appearing acetabular subchondral
cyst on the right. No destructive osseous lesion identified.
IMPRESSION: 1. High-grade Acute Small Bowel Obstruction with an abrupt
transition point in the central abdomen at the level of the
umbilicus. See series 3 images 65 through 72. Associated severe gas
and fluid distention of the stomach.
2. No abdominal free air or free fluid.
3. Urinary bladder now decompressed by Foley catheter.
4. Otherwise stable noncontrast CT appearance of the abdomen and
Pelvis since 05/25/2017, including aortic atherosclerosis and 3.4 cm
left iliac artery aneurysm.

## 2018-10-17 ENCOUNTER — Telehealth: Payer: Self-pay | Admitting: *Deleted

## 2018-10-18 NOTE — Telephone Encounter (Signed)
Thank you for informing me.

## 2018-11-06 NOTE — Telephone Encounter (Signed)
EMS calls and states at 1547 today pt was found deceased on his living room floor, he was pronounced at 1547, no issue for medical examiner, 1 hr earlier his brother was found on front porch of home in cardiac emergency

## 2018-11-06 DEATH — deceased
